# Patient Record
Sex: Male | Born: 1970 | Race: White | Hispanic: No | Marital: Married | Smoking: Never smoker
Health system: Southern US, Community
[De-identification: ages and names within clinical notes are randomized; demographics above are authoritative.]

## PROBLEM LIST (undated history)

## (undated) DIAGNOSIS — E669 Obesity, unspecified: Secondary | ICD-10-CM

## (undated) DIAGNOSIS — R002 Palpitations: Secondary | ICD-10-CM

## (undated) DIAGNOSIS — E079 Disorder of thyroid, unspecified: Secondary | ICD-10-CM

## (undated) DIAGNOSIS — F1020 Alcohol dependence, uncomplicated: Secondary | ICD-10-CM

## (undated) DIAGNOSIS — I480 Paroxysmal atrial fibrillation: Secondary | ICD-10-CM

## (undated) DIAGNOSIS — Z87898 Personal history of other specified conditions: Secondary | ICD-10-CM

## (undated) DIAGNOSIS — Z8679 Personal history of other diseases of the circulatory system: Secondary | ICD-10-CM

## (undated) HISTORY — DX: Palpitations: R00.2

## (undated) HISTORY — DX: Obesity, unspecified: E66.9

## (undated) HISTORY — DX: Personal history of other specified conditions: Z87.898

## (undated) HISTORY — DX: Personal history of other diseases of the circulatory system: Z86.79

## (undated) HISTORY — DX: Paroxysmal atrial fibrillation: I48.0

## (undated) HISTORY — PX: THYROIDECTOMY: SHX17

---

## 2018-08-03 ENCOUNTER — Encounter (HOSPITAL_COMMUNITY): Payer: Self-pay | Admitting: Emergency Medicine

## 2018-08-03 ENCOUNTER — Ambulatory Visit (HOSPITAL_COMMUNITY)
Admission: EM | Admit: 2018-08-03 | Discharge: 2018-08-03 | Disposition: A | Discharge status: Home / Self Care | Payer: Managed Care, Other (non HMO) | Attending: Family Medicine | Admitting: Family Medicine

## 2018-08-03 DIAGNOSIS — E785 Hyperlipidemia, unspecified: Secondary | ICD-10-CM

## 2018-08-03 DIAGNOSIS — R21 Rash and other nonspecific skin eruption: Secondary | ICD-10-CM | POA: Diagnosis not present

## 2018-08-03 DIAGNOSIS — Z760 Encounter for issue of repeat prescription: Secondary | ICD-10-CM

## 2018-08-03 DIAGNOSIS — E039 Hypothyroidism, unspecified: Secondary | ICD-10-CM

## 2018-08-03 HISTORY — DX: Disorder of thyroid, unspecified: E07.9

## 2018-08-03 MED ORDER — LIOTHYRONINE SODIUM 25 MCG PO TABS: 25.0000 ug | ORAL_TABLET | Freq: Every day | ORAL | 0 refills | Status: AC

## 2018-08-03 MED ORDER — CLOTRIMAZOLE-BETAMETHASONE 1-0.05 % EX CREA: TOPICAL_CREAM | CUTANEOUS | 0 refills | Status: DC

## 2018-08-03 MED ORDER — PROPRANOLOL HCL ER 160 MG PO CP24: 160.0000 mg | ORAL_CAPSULE | Freq: Every day | ORAL | 0 refills | Status: AC

## 2018-08-03 MED ORDER — SIMVASTATIN 20 MG PO TABS: 20.0000 mg | ORAL_TABLET | Freq: Every day | ORAL | 0 refills | Status: AC

## 2018-08-03 MED ORDER — LEVOTHYROXINE SODIUM 50 MCG PO TABS: 50.0000 ug | ORAL_TABLET | Freq: Every day | ORAL | 0 refills | Status: AC

## 2018-08-03 NOTE — ED Triage Notes (Signed)
Pt c/o rash on his R ankle x1 month

## 2018-08-05 NOTE — ED Provider Notes (Signed)
[facility]   [id number] 08/03/18 Arrival Time: 1523  ASSESSMENT & PLAN:  1. Rash and nonspecific skin eruption   2. Hypothyroidism, unspecified type   3. Hyperlipidemia, unspecified hyperlipidemia type   4. Medication refill    Meds ordered this encounter  Medications  . clotrimazole-betamethasone (LOTRISONE) cream    Sig: Apply to affected area 2 times daily for 1-2 weeks.    Dispense:  45 g    Refill:  0  . levothyroxine (SYNTHROID, LEVOTHROID) 50 MCG tablet    Sig: Take 1 tablet (50 mcg total) by mouth daily before breakfast.    Dispense:  30 tablet    Refill:  0  . liothyronine (CYTOMEL) 25 MCG tablet    Sig: Take 1 tablet (25 mcg total) by mouth daily.    Dispense:  30 tablet    Refill:  0  . propranolol ER (INDERAL LA) 160 MG SR capsule    Sig: Take 1 capsule (160 mg total) by mouth daily.    Dispense:  30 capsule    Refill:  0  . simvastatin (ZOCOR) 20 MG tablet    Sig: Take 1 tablet (20 mg total) by mouth daily at 6 PM.    Dispense:  30 tablet    Refill:  0   Trial of Lotrisone for rash on ankle. Medications refilled as above. Reports chronic medical problems are stable.  Has upcoming appt to see a new PCP. May f/u here as needed.  Reviewed expectations re: course of current medical issues. Questions answered. Outlined signs and symptoms indicating need for more acute intervention. Patient verbalized understanding. After Visit Summary given.   SUBJECTIVE:  Brett Bailey is a 48 y.o. male who presents with a skin complaint.   Location: inner R ankle Onset: gradual Duration: over the past month Associated pruritis? moderate Associated pain? none Progression: increasing steadily  Drainage? No  Known trigger? No  New soaps/lotions/topicals/detergents/environmental exposures? No Contacts with similar? No Recent travel? No  Other associated symptoms: none reported Therapies tried thus far: none Arthralgia or myalgia? none Recent illness?  none Fever? none No specific aggravating or alleviating factors reported. No injury to this area.  Also requests medication refills for diagnoses listed above. No concerns.  ROS: As per HPI. All other systems negative.   OBJECTIVE: Vitals:   08/03/18 1550  BP: 133/79  Pulse: 76  Resp: 16  Temp: 98.5 F (36.9 C)  TempSrc: Oral  SpO2: 100%    General appearance: alert; no distress Lungs: clear to auscultation bilaterally Heart: regular rate and rhythm Abd: soft; non-tender Extremities: no edema Skin: warm and dry; signs of infection: no; a 2x3 cm patch on inner R ankle of diffuse hyperkeratotic eruption; some underlying erythema; no tenderness to palpation; normal skin temperature; mild scaling Neuro: normal gait Psychological: alert and cooperative; normal mood and affect  No Known Allergies  Past Medical History:  Diagnosis Date  . Thyroid disease    Social History   Socioeconomic History  . Marital status: Married    Spouse name: Not on file  . Number of children: Not on file  . Years of education: Not on file  . Highest education level: Not on file  Occupational History  . Not on file  Social Needs  . Financial resource strain: Not on file  . Food insecurity:    Worry: Not on file    Inability: Not on file  . Transportation needs:    Medical: Not on file  Non-medical: Not on file  Tobacco Use  . Smoking status: Never Smoker  Substance and Sexual Activity  . Alcohol use: Yes  . Drug use: Never  . Sexual activity: Not on file  Lifestyle  . Physical activity:    Days per week: Not on file    Minutes per session: Not on file  . Stress: Not on file  Relationships  . Social connections:    Talks on phone: Not on file    Gets together: Not on file    Attends religious service: Not on file    Active member of club or organization: Not on file    Attends meetings of clubs or organizations: Not on file    Relationship status: Not on file  . Intimate  partner violence:    Fear of current or ex partner: Not on file    Emotionally abused: Not on file    Physically abused: Not on file    Forced sexual activity: Not on file  Other Topics Concern  . Not on file  Social History Narrative  . Not on file   Family History  Problem Relation Age of Onset  . Heart failure Father    History reviewed. No pertinent surgical history.   Mardella Layman, MD 08/05/18 1045

## 2019-08-03 ENCOUNTER — Ambulatory Visit: Payer: Managed Care, Other (non HMO) | Admitting: Podiatry

## 2019-08-03 ENCOUNTER — Encounter: Payer: Self-pay | Admitting: Podiatry

## 2019-08-03 ENCOUNTER — Other Ambulatory Visit: Payer: Self-pay

## 2019-08-03 VITALS — BP 131/70 | HR 72 | Resp 16

## 2019-08-03 DIAGNOSIS — L600 Ingrowing nail: Secondary | ICD-10-CM | POA: Diagnosis not present

## 2019-08-03 MED ORDER — NEOMYCIN-POLYMYXIN-HC 1 % OT SOLN: OTIC | 1 refills | Status: DC

## 2019-08-03 NOTE — Progress Notes (Signed)
  Subjective:  Patient ID: Brett Bailey, male    DOB: [birth year],  MRN: [id number] HPI Chief Complaint  Patient presents with  . Toe Pain    Hallux right - lateral border, tender, red, swollen, draining x 70months, tried trimming and cleaning, took antibiotic - completed Saturday  . New Patient (Initial Visit)    48 y.o. male presents with the above complaint.   ROS: Denies fever chills nausea vomiting muscle aches pains calf pain back pain chest pain shortness of breath.  Past Medical History:  Diagnosis Date  . Thyroid disease    No past surgical history on file.  Current Outpatient Medications:  .  clotrimazole-betamethasone (LOTRISONE) cream, Apply to affected area 2 times daily for 1-2 weeks., Disp: 45 g, Rfl: 0 .  cyclobenzaprine (FLEXERIL) 10 MG tablet, Take 5-10 mg by mouth 3 (three) times daily as needed., Disp: , Rfl:  .  levothyroxine (SYNTHROID, LEVOTHROID) 50 MCG tablet, Take 1 tablet (50 mcg total) by mouth daily before breakfast., Disp: 30 tablet, Rfl: 0 .  liothyronine (CYTOMEL) 25 MCG tablet, Take 1 tablet (25 mcg total) by mouth daily., Disp: 30 tablet, Rfl: 0 .  propranolol ER (INDERAL LA) 160 MG SR capsule, Take 1 capsule (160 mg total) by mouth daily., Disp: 30 capsule, Rfl: 0 .  simvastatin (ZOCOR) 20 MG tablet, Take 1 tablet (20 mg total) by mouth daily at 6 PM., Disp: 30 tablet, Rfl: 0  No Known Allergies Review of Systems Objective:   Vitals:   08/03/19 0944  BP: 131/70  Pulse: 72  Resp: 16    General: Well developed, nourished, in no acute distress, alert and oriented x3   Dermatological: Skin is warm, dry and supple bilateral. Nails x 10 are well maintained; remaining integument appears unremarkable at this time. There are no open sores, no preulcerative lesions, no rash or signs of infection present.  Sharp incurvated lateral nail border hallux right.  Purulence malodor granulation tissue is present.  Vascular: Dorsalis Pedis artery and Posterior  Tibial artery pedal pulses are 2/4 bilateral with immedate capillary fill time. Pedal hair growth present. No varicosities and no lower extremity edema present bilateral.   Neruologic: Grossly intact via light touch bilateral. Vibratory intact via tuning fork bilateral. Protective threshold with Semmes Wienstein monofilament intact to all pedal sites bilateral. Patellar and Achilles deep tendon reflexes 2+ bilateral. No Babinski or clonus noted bilateral.   Musculoskeletal: No gross boney pedal deformities bilateral. No pain, crepitus, or limitation noted with foot and ankle range of motion bilateral. Muscular strength 5/5 in all groups tested bilateral.  Gait: Unassisted, Nonantalgic.    Radiographs:  None taken  Assessment & Plan:   Assessment: Ingrown toenail right lateral border  Plan: Discussed etiology pathology conservative versus surgical therapies chemical matrixectomy was performed today after local anesthetic was administered.  Tolerated procedure well without complications.  Was provided with both oral and written home-going instructions for the care and soaking of the toe.  He was also provided with a prescription for Corticosporin otic to be applied twice daily after soaking.  I will follow-up with him in 2 weeks.     Chanie Soucek T. [place], [place]

## 2019-08-03 NOTE — Patient Instructions (Signed)

## 2019-08-17 ENCOUNTER — Ambulatory Visit: Payer: Managed Care, Other (non HMO) | Admitting: Podiatry

## 2020-01-22 ENCOUNTER — Other Ambulatory Visit: Payer: Self-pay

## 2020-01-22 ENCOUNTER — Emergency Department (HOSPITAL_COMMUNITY): Payer: Managed Care, Other (non HMO)

## 2020-01-22 ENCOUNTER — Emergency Department (HOSPITAL_COMMUNITY)
Admission: EM | Admit: 2020-01-22 | Discharge: 2020-01-22 | Disposition: A | Discharge status: Home / Self Care | Payer: Managed Care, Other (non HMO) | Attending: Emergency Medicine | Admitting: Emergency Medicine

## 2020-01-22 ENCOUNTER — Ambulatory Visit (INDEPENDENT_AMBULATORY_CARE_PROVIDER_SITE_OTHER)
Admission: EM | Admit: 2020-01-22 | Discharge: 2020-01-22 | Disposition: A | Discharge status: Home / Self Care | Payer: Managed Care, Other (non HMO) | Source: Home / Self Care

## 2020-01-22 ENCOUNTER — Encounter (HOSPITAL_COMMUNITY): Payer: Self-pay

## 2020-01-22 DIAGNOSIS — R9431 Abnormal electrocardiogram [ECG] [EKG]: Secondary | ICD-10-CM

## 2020-01-22 DIAGNOSIS — R072 Precordial pain: Secondary | ICD-10-CM | POA: Diagnosis not present

## 2020-01-22 DIAGNOSIS — R002 Palpitations: Secondary | ICD-10-CM

## 2020-01-22 DIAGNOSIS — F1020 Alcohol dependence, uncomplicated: Secondary | ICD-10-CM | POA: Insufficient documentation

## 2020-01-22 DIAGNOSIS — Z79899 Other long term (current) drug therapy: Secondary | ICD-10-CM | POA: Diagnosis not present

## 2020-01-22 HISTORY — DX: Alcohol dependence, uncomplicated: F10.20

## 2020-01-22 LAB — CBC
HCT: 45.9 % (ref 39.0–52.0)
Hemoglobin: 16.5 g/dL (ref 13.0–17.0)
MCH: 34.7 pg — ABNORMAL HIGH (ref 26.0–34.0)
MCHC: 35.9 g/dL (ref 30.0–36.0)
MCV: 96.4 fL (ref 80.0–100.0)
Platelets: 192 10*3/uL (ref 150–400)
RBC: 4.76 MIL/uL (ref 4.22–5.81)
RDW: 12.7 % (ref 11.5–15.5)
WBC: 8.5 10*3/uL (ref 4.0–10.5)
nRBC: 0 % (ref 0.0–0.2)

## 2020-01-22 LAB — BASIC METABOLIC PANEL
Anion gap: 11 (ref 5–15)
BUN: 11 mg/dL (ref 6–20)
CO2: 22 mmol/L (ref 22–32)
Calcium: 9.2 mg/dL (ref 8.9–10.3)
Chloride: 104 mmol/L (ref 98–111)
Creatinine, Ser: 0.72 mg/dL (ref 0.61–1.24)
GFR calc Af Amer: >60 mL/min (ref >60)
GFR calc non Af Amer: >60 mL/min (ref >60)
Glucose, Bld: 125 mg/dL — ABNORMAL HIGH (ref 70–99)
Potassium: 4 mmol/L (ref 3.5–5.1)
Sodium: 137 mmol/L (ref 135–145)

## 2020-01-22 LAB — TROPONIN I (HIGH SENSITIVITY): Troponin I (High Sensitivity): 3 ng/L (ref <18)

## 2020-01-22 MED ORDER — CHLORDIAZEPOXIDE HCL 25 MG PO CAPS: ORAL_CAPSULE | ORAL | 0 refills | Status: DC

## 2020-01-22 MED ORDER — SODIUM CHLORIDE 0.9% FLUSH: 3.0000 mL | Freq: Once | INTRAVENOUS | Status: DC

## 2020-01-22 NOTE — ED Triage Notes (Signed)
Pain in center of chest with deep breath that started on Wednesday. Temp on sat was 100, but that has went down.  Last night pt felt like his heart was racing.  He feels like his heart is racing now but not as bad as last night.  Reports his cp has gotten better but he stills feels it on and off.

## 2020-01-22 NOTE — ED Triage Notes (Signed)
Pt reports pain in the center of chest last week, but has resolved, but gets a pain with deep breathing. Pt reports he had a temp of 100 on Saturday. Approx 1230 last night he experienced heart palpitations where his heart skipped beats and fast. Resolved after 4 hours.

## 2020-01-22 NOTE — Discharge Instructions (Addendum)
You are seen in the ER for palpitations and chest discomfort.  It appears to [place] that you might possibly have a condition called pericarditis or atrial fibrillation.  Cardiomyopathy from heavy alcohol use is also a possibility.   Please follow-up with cardiology services requested for complete work-up.  We recommend that you return to the ER if you start having severe chest pain, dizziness, near fainting spells, shortness of breath or severe palpitations.   [facility]      [address] [place], [place] [address]  [phone]       [facility] [address] [place], [place] [address] [phone]  [facility] [place]        [phone]       [facility] [phone]  [facility] ([facility]             [address]                                         [place], [place]                                                [phone] or [phone]                               [facility] [place] [address] [place], [address] [phone]  [facility]    [address]      [place], [place]     [phone]       [facility] [address] [place], [place] [phone]  [facility]   [address] [place], [place] [address]     [phone]      Admissions: 8am-3pm M-F  [facility] ([facility]) [address] [place], [place] [phone]  [facility]: Residential Program [phone] Days)   [place], [place]      [phone] or [phone]     [facility]: [facility] [place], [place] (Walk in Hours over the weekend or by referral)  [facility] [address] [place], [place], [place] [address] [phone]  Crisis Mobile: [facility]:  [phone] (for crisis response 24 hours a day) [facility] Hotline:      [phone] Outpatient Psychiatry and Counseling  [facility]: Mobile Crisis Management 24 hours:  [phone]  [facility] [employer] sliding scale fee and walk in schedule: M-F 8am-12pm/1pm-3pm [address]  [place], [place] [address] [phone]  [facility] [address] [place], [place] [address] [phone]  [facility] new patient walk-in appointments available Monday - Friday 8am -3pm.          [address] [place], [place] [address] [phone] or crisis line- [phone]  [facility]/ Intensive Outpatient Therapy Program [address] [place], [place] [address] [phone]  [facility]                  Crisis Services      [phone] [address]     [place], [place] [address]  [facility] [phone]. [place], [place] [address]   [employer] of Care          [address]ette [address]  [place], [place] [address]       [phone]  [place], [place], [place] [address] [phone]  [facility]    [address] [place], [place] [address]     [place], [place] Joycelyn Man     [place] [address]     [phone]       [facility] [place] [address]  [facility]     [address] [place], [place], [place], [place] [address] Index     [address]     [place], [place] [address]     [phone]       [facility] [address] [place], [place] [address] [phone] Resources for Temporary Residential Assistance/Crisis [facility]) M-F 8am-3pm   [address] [place],  [address]   [phone] Services include: laundry, barbering, support groups, case management, phone  & computer access, showers, AA/NA mtgs, mental health/substance abuse nurse, job skills class, disability information, VA assistance, spiritual classes, etc.

## 2020-01-22 NOTE — ED Provider Notes (Signed)
[facility] EMERGENCY DEPARTMENT Provider Note   CSN: [id number] Arrival date & time: 01/22/20  1202     History Chief Complaint  Patient presents with  . Palpitations    Brett Bailey is a 49 y.o. male.  HPI  HPI: A 49 year old patient with a history of obesity presents for evaluation of chest pain. Initial onset of pain was more than 6 hours ago. The patient's chest pain is described as heaviness/pressure/tightness and is not worse with exertion. The patient's chest pain is middle- or left-sided, is not well-localized, is not sharp and does not radiate to the arms/jaw/neck. The patient does not complain of nausea and denies diaphoresis. The patient has no history of stroke, has no history of peripheral artery disease, has not smoked in the past 90 days, denies any history of treated diabetes, has no relevant family history of coronary artery disease (first degree relative at less than age 75), is not hypertensive and has no history of hypercholesterolemia.  49 year old male with history of thyroidectomy, on thyroid meds, alcoholism comes in a chief complaint of palpitations.  Patient reports that he started having some chest discomfort 5 or 6 days ago.  He had central chest pain that was worse with deep inspiration and was fairly constant.  He also felt a little more uncomfortable laying flat.  There was no exertional component to the pain and patient denies any associated shortness of breath, dizziness.  2 days ago patient started noticing low-grade fever.  He had a T-max of 100.  The chest pain resolved 2 days ago.  Yesterday night patient had palpitations that stayed for 4 hours.  Patient felt like his heart was racing and skipping beats.  He has not had that sensation before.  More recently he has not had any exertional chest pain or shortness of breath.  Patient denies any family history of premature CAD, arrhythmia, sudden cardiac death.  Patient does admit to heavy alcohol  consumption for the last several years.  He denies any cocaine use. Pt has no hx of PE, DVT and denies any exogenous hormone (testosterone / estrogen) use, long distance travels or surgery in the past 6 weeks, active cancer, recent immobilization.   Past Medical History:  Diagnosis Date  . Alcoholic ([place])   . Thyroid disease     Patient Active Problem List   Diagnosis Date Noted  . Weight gain 03/11/2009  . Libido, decreased 10/16/2008  . Insomnia 05/22/2005  . Tremor 05/22/2005  . Reactive airway disease 11/11/2004  . Headache 04/21/2004  . Alcohol abuse 05/28/2003  . 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 Drug abuse ([place]) 05/28/2003  . Nonspecific elevation of levels of transaminase or lactic acid dehydrogenase (LDH) 05/28/2003  . Pure hypercholesterolemia 03/16/2003  . Obesity 03/15/2003  . Bipolar affective disorder ([place]) 12/22/2001  . Family history of ischemic heart disease 12/22/2001  . Hypothyroidism 12/22/2001    Past Surgical History:  Procedure Laterality Date  . THYROIDECTOMY         Family History  Problem Relation Age of Onset  . Heart failure Father     Social History   Tobacco Use  . Smoking status: Never Smoker  . Smokeless tobacco: Never Used  Substance Use Topics  . Alcohol use: Yes    Comment:  1 pint of liquor daily  . Drug use: Never    Home Medications Prior to Admission medications   Medication Sig Start Date End Date Taking? Authorizing Provider  chlordiazePOXIDE (LIBRIUM) 25  MG capsule 50mg  PO TID x 1D, then 25-50mg  PO BID X 1D, then 25-50mg  PO QD X 1D 01/22/20   01/24/20, MD  clotrimazole-betamethasone (LOTRISONE) cream Apply to affected area 2 times daily for 1-2 weeks. 08/03/18   10/02/18, MD  cyclobenzaprine (FLEXERIL) 10 MG tablet Take 5-10 mg by mouth 3 (three) times daily as needed. 07/21/19   [provider]  levothyroxine (SYNTHROID, LEVOTHROID) 50 MCG tablet Take 1 tablet (50 mcg total) by mouth daily  before breakfast. 08/03/18   10/02/18, MD  liothyronine (CYTOMEL) 25 MCG tablet Take 1 tablet (25 mcg total) by mouth daily. 08/03/18   10/02/18, MD  NEOMYCIN-POLYMYXIN-HYDROCORTISONE (CORTISPORIN) 1 % SOLN OTIC solution Apply 1-2 drops to toe BID after soaking 08/03/19   Hyatt, Max T, DPM  propranolol ER (INDERAL LA) 160 MG SR capsule Take 1 capsule (160 mg total) by mouth daily. 08/03/18   10/02/18, MD  simvastatin (ZOCOR) 20 MG tablet Take 1 tablet (20 mg total) by mouth daily at 6 PM. 08/03/18   10/02/18, MD    Allergies    Patient has no known allergies.  Review of Systems   Review of Systems  Constitutional: Positive for activity change.  Respiratory: Negative for shortness of breath.   Cardiovascular: Positive for chest pain.  Gastrointestinal: Negative for nausea and vomiting.  Musculoskeletal: Positive for back pain.  Neurological: Negative for dizziness.  All other systems reviewed and are negative.   Physical Exam Updated Vital Signs BP 111/77 (BP Location: Right Arm)   Pulse 70   Temp 98.2 F (36.8 C) (Oral)   Resp 18   Ht 5\' 11"  (1.803 m)   Wt 124.7 kg   SpO2 100%   BMI 38.35 kg/m   Physical Exam Vitals and nursing note reviewed.  Constitutional:      Appearance: He is well-developed.  HENT:     Head: Atraumatic.  Cardiovascular:     Rate and Rhythm: Normal rate.     Pulses: Normal pulses.  Pulmonary:     Effort: Pulmonary effort is normal.  Musculoskeletal:     Cervical back: Neck supple.     Right lower leg: No edema.     Left lower leg: No edema.  Skin:    General: Skin is warm.     Findings: Lesion: .edekg.  Neurological:     Mental Status: He is alert and oriented to person, place, and time.     ED Results / Procedures / Treatments   Labs (all labs ordered are listed, but only abnormal results are displayed) Labs Reviewed  BASIC METABOLIC PANEL - Abnormal; Notable for the following components:      Result Value   Glucose,  Bld 125 (*)    All other components within normal limits  CBC - Abnormal; Notable for the following components:   MCH 34.7 (*)    All other components within normal limits  TROPONIN I (HIGH SENSITIVITY)  TROPONIN I (HIGH SENSITIVITY)    EKG None  Date: 01/22/2020  Rate: 72  Rhythm: normal sinus rhythm  QRS Axis: normal  Intervals: normal  ST/T Wave abnormalities: normal  Conduction Disutrbances: none  Narrative Interpretation: unremarkable   Radiology DG Chest 2 View  Result Date: 01/22/2020 CLINICAL DATA:  Chest pain, palpitations, and recent fever. EXAM: CHEST - 2 VIEW COMPARISON:  None. FINDINGS: The cardiomediastinal silhouette is within normal limits. The lungs are well inflated. No confluent airspace opacity, edema, sizable pleural effusion, or  pneumothorax is identified with note made of incomplete imaging of the posterior costophrenic angles. There is mild-to-moderate thoracic dextroscoliosis. IMPRESSION: No active cardiopulmonary disease. Electronically Signed   By: Sebastian Ache M.D.   On: 01/22/2020 14:28    Procedures Procedures (including critical care time)  Medications Ordered in ED Medications  sodium chloride flush (NS) 0.9 % injection 3 mL (has no administration in time range)    ED Course  I have reviewed the triage vital signs and the nursing notes.  Pertinent labs & imaging results that were available during my care of the patient were reviewed by me and considered in my medical decision making (see chart for details).    MDM Rules/Calculators/A&P HEAR Score: 79                        49 year old comes in a chief complaint of chest pain, palpitations, low-grade fever. His symptoms started 5 days ago with chest discomfort that has now been resolved for 2 days.  Subsequently he had a low-grade temperature of 100 without any cough.  He also had an episode of palpitations last night that lasted for 4 hours.  Based on history and exam, differential  diagnosis includes A. Fib, PSVT.  We also considered alcohol induced cardiomyopathy and pericarditis in the differential.  With the pleuritic pain we do not think patient has PE.  He is PERC negative and his well score is 0.  ACS also considered in the differential.  Plan is for patient to follow-up with cardiology.  I think he will need Holter/event monitoring and echocardiogram.  Strict ER return precautions have been discussed.  Patient is also willing to consider quitting alcohol.  He is supposed to see his PCP tomorrow.  Will provide outpatient resources.  Patient is wanting to consider Librium taper.  He has been prescribed Librium, with warning of ensuring that he does not mix it with alcohol.  Final Clinical Impression(s) / ED Diagnoses Final diagnoses:  Palpitations  Precordial chest pain  Alcohol use disorder, severe, in controlled environment [facility])    Rx / DC Orders ED Discharge Orders         Ordered    chlordiazePOXIDE (LIBRIUM) 25 MG capsule     Discontinue  Reprint     01/22/20 1609           Derwood Kaplan, MD 01/22/20 1621

## 2020-01-22 NOTE — ED Notes (Signed)
Patient is being discharged from the Urgent Care and sent to the Emergency Department via pov . Per [place], [place], patient is in need of higher level of care due to cp. Patient is aware and verbalizes understanding of plan of care.  Vitals:   01/22/20 1047  BP: 116/83  Pulse: 77  Resp: 18  Temp: 98.1 F (36.7 C)  SpO2: 96%

## 2020-01-29 ENCOUNTER — Encounter (INDEPENDENT_AMBULATORY_CARE_PROVIDER_SITE_OTHER): Payer: Self-pay | Admitting: *Deleted

## 2020-02-16 ENCOUNTER — Encounter: Payer: Self-pay | Admitting: Cardiology

## 2020-02-16 ENCOUNTER — Ambulatory Visit: Payer: Managed Care, Other (non HMO) | Admitting: Cardiology

## 2020-02-16 VITALS — BP 120/68 | HR 66 | Ht 71.0 in | Wt 274.0 lb

## 2020-02-16 DIAGNOSIS — R0789 Other chest pain: Secondary | ICD-10-CM | POA: Diagnosis not present

## 2020-02-16 DIAGNOSIS — R002 Palpitations: Secondary | ICD-10-CM

## 2020-02-16 NOTE — Progress Notes (Signed)
Clinical Summary Brett Bailey is a 49 y.o.male seen as new patient, seen today for the following medical problems.    1. Chest pain/Palpitations - seen in ER 01/22/20 with chest pain - trop neg x 1 - EKG SR, no ischemic changes - CXR no acute process  - midchest pain, pulled muscle like feeling. Worst with deep breathing, positional. Pain would come and go. Had some fever/felt flushed for about 24 hours.  - 2 days later had some palpitations. Flipping/racing feel, felt pulse and felt irregular. Episode x 7 hours - some lingering chest pains that has since resolved.  - limited caffeine. 7 days drinks 10-15 drinks vodka. - he is on propanolol for tremor - he reports thyroid levels have been controlled      Past Medical History:  Diagnosis Date  . Alcoholic (HCC)   . Thyroid disease      No Known Allergies   Current Outpatient Medications  Medication Sig Dispense Refill  . chlordiazePOXIDE (LIBRIUM) 25 MG capsule 50mg  PO TID x 1D, then 25-50mg  PO BID X 1D, then 25-50mg  PO QD X 1D 10 capsule 0  . clotrimazole-betamethasone (LOTRISONE) cream Apply to affected area 2 times daily for 1-2 weeks. 45 g 0  . cyclobenzaprine (FLEXERIL) 10 MG tablet Take 5-10 mg by mouth 3 (three) times daily as needed.    levothyroxine (SYNTHROID, LEVOTHROID) 50 MCG tablet Take 1 tablet (50 mcg total) by mouth daily before breakfast. 30 tablet 0  . liothyronine (CYTOMEL) 25 MCG tablet Take 1 tablet (25 mcg total) by mouth daily. 30 tablet 0  . NEOMYCIN-POLYMYXIN-HYDROCORTISONE (CORTISPORIN) 1 % SOLN OTIC solution Apply 1-2 drops to toe BID after soaking 10 mL 1  . propranolol ER (INDERAL LA) 160 MG SR capsule Take 1 capsule (160 mg total) by mouth daily. 30 capsule 0  . simvastatin (ZOCOR) 20 MG tablet Take 1 tablet (20 mg total) by mouth daily at 6 PM. 30 tablet 0   No current facility-administered medications for this visit.     Past Surgical History:  Procedure Laterality Date  .  THYROIDECTOMY       No Known Allergies    Family History  Problem Relation Age of Onset  . Heart failure Father      Social History Mr. Skipper reports that he has never smoked. He has never used smokeless tobacco. Mr. Kapusta reports current alcohol use.   Review of Systems CONSTITUTIONAL: No weight loss, fever, chills, weakness or fatigue.  HEENT: Eyes: No visual loss, blurred vision, double vision or yellow sclerae.No hearing loss, sneezing, congestion, runny nose or sore throat.  SKIN: No rash or itching.  CARDIOVASCULAR: per hpi RESPIRATORY: No shortness of breath, cough or sputum.  GASTROINTESTINAL: No anorexia, nausea, vomiting or diarrhea. No abdominal pain or blood.  GENITOURINARY: No burning on urination, no polyuria NEUROLOGICAL: No headache, dizziness, syncope, paralysis, ataxia, numbness or tingling in the extremities. No change in bowel or bladder control.  MUSCULOSKELETAL: No muscle, back pain, joint pain or stiffness.  LYMPHATICS: No enlarged nodes. No history of splenectomy.  PSYCHIATRIC: No history of depression or anxiety.  ENDOCRINOLOGIC: No reports of sweating, cold or heat intolerance. No polyuria or polydipsia.  Lemont Fillers   Physical Examination Today's Vitals   02/16/20 1035  BP: 120/68  Pulse: 66  SpO2: 98%  Weight: 274 lb (124.3 kg)  Height: 5\' 11"  (1.803 m)   Body mass index is 38.22 kg/m.  Gen: resting comfortably, no acute distress HEENT:  no scleral icterus, pupils equal round and reactive, no palptable cervical adenopathy,  CV: RRR, no m/r/g, no jvd Resp: Clear to auscultation bilaterally GI: abdomen is soft, non-tender, non-distended, normal bowel sounds, no hepatosplenomegaly MSK: extremities are warm, no edema.  Skin: warm, no rash Neuro:  no focal deficits Psych: appropriate affect     Assessment and Plan  1. Chest pain/palpitations - isolated episode. Chest pain consistent with pleuritic chest pain. In setting of mild fever  perhaps transient viral URI leading to some pleuritic chest pains. Resolved without recurrence, no symptoms to suggest ischemia or necessitate ischemic workup - palpitations x 7 hours likely was in a transient arrhythmia, perhaps also driven by a viral illness. Significant EtOH use that also may have triggered - no recurrent symptoms. If recurrence would plan on outpatient event monitor.   F/u 1 year. He is to contact us if recurrent symptosm prior    Antoine Poche, M.D.

## 2020-02-16 NOTE — Patient Instructions (Signed)

## 2020-04-16 ENCOUNTER — Other Ambulatory Visit: Payer: Self-pay

## 2020-04-16 ENCOUNTER — Ambulatory Visit
Admission: RE | Admit: 2020-04-16 | Discharge: 2020-04-16 | Disposition: A | Discharge status: Home / Self Care | Payer: Managed Care, Other (non HMO) | Source: Ambulatory Visit | Attending: Internal Medicine | Admitting: Internal Medicine

## 2020-04-16 VITALS — BP 124/81 | HR 77 | Temp 99.1°F | Resp 20

## 2020-04-16 DIAGNOSIS — J208 Acute bronchitis due to other specified organisms: Secondary | ICD-10-CM | POA: Diagnosis not present

## 2020-04-16 DIAGNOSIS — Z1152 Encounter for screening for COVID-19: Secondary | ICD-10-CM

## 2020-04-16 MED ORDER — PREDNISONE 20 MG PO TABS: 20.0000 mg | ORAL_TABLET | Freq: Every day | ORAL | 0 refills | Status: AC

## 2020-04-16 MED ORDER — BENZONATATE 100 MG PO CAPS: 100.0000 mg | ORAL_CAPSULE | Freq: Three times a day (TID) | ORAL | 0 refills | Status: DC

## 2020-04-16 MED ORDER — GUAIFENESIN ER 600 MG PO TB12: 600.0000 mg | ORAL_TABLET | Freq: Two times a day (BID) | ORAL | 0 refills | Status: AC

## 2020-04-16 NOTE — ED Triage Notes (Signed)
Pt presents with c/o cough and chest congestion as well as sore throat that began on Sunday

## 2020-04-16 NOTE — Discharge Instructions (Signed)
Please return to urgent care if you develop any worsening shortness of breath, cough ,fever or chills. Please quarantine until covid-19 test results is available.

## 2020-04-17 LAB — SARS-COV-2, NAA 2 DAY TAT

## 2020-04-17 LAB — NOVEL CORONAVIRUS, NAA: SARS-CoV-2, NAA: NOT DETECTED

## 2020-04-19 NOTE — ED Provider Notes (Signed)
[facility]    CSN: [id number] Arrival date & time: 04/16/20  1556      History   Chief Complaint Chief Complaint  Patient presents with  . Cough  . Sore Throat    HPI Brett Bailey is a 49 y.o. male comes to urgent care with a history of nonproductive cough, chest congestion and a sore throat of 3 days duration.  Patient denies any shortness of breath,generalized body aches or exposure to sick person.  No nausea, vomiting or diarrhea.  No dizziness, near syncope or syncopal episodes.  Oral intake is preserved.  Patient admits to having intermittent occasional wheezing.   He has had several episodes of bronchitis in the past and usually course of prednisone he states his recovery.  No fever or chills. HPI  Past Medical History:  Diagnosis Date  . Alcoholic (HCC)   . Thyroid disease     Patient Active Problem List   Diagnosis Date Noted  . Weight gain 03/11/2009  . Libido, decreased 10/16/2008  . Insomnia 05/22/2005  . Tremor 05/22/2005  . Reactive airway disease 11/11/2004  . Headache 04/21/2004  . Alcohol abuse 05/28/2003  . 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 Drug abuse (HCC) 05/28/2003  . Nonspecific elevation of levels of transaminase or lactic acid dehydrogenase (LDH) 05/28/2003  . Pure hypercholesterolemia 03/16/2003  . Obesity 03/15/2003  . Bipolar affective disorder (HCC) 12/22/2001  . Family history of ischemic heart disease 12/22/2001  . Hypothyroidism 12/22/2001    Past Surgical History:  Procedure Laterality Date  . THYROIDECTOMY         Home Medications    Prior to Admission medications   Medication Sig Start Date End Date Taking? Authorizing Provider  benzonatate (TESSALON) 100 MG capsule Take 1 capsule (100 mg total) by mouth every 8 (eight) hours. 04/16/20   Kennede Lusk, Britta Mccreedy, MD  chlordiazePOXIDE (LIBRIUM) 25 MG capsule 50mg  PO TID x 1D, then 25-50mg  PO BID X 1D, then 25-50mg  PO QD X 1D 01/22/20   01/24/20, MD    cyclobenzaprine (FLEXERIL) 10 MG tablet Take 5-10 mg by mouth 3 (three) times daily as needed. 07/21/19   [provider]  guaiFENesin (MUCINEX) 600 MG 12 hr tablet Take 1 tablet (600 mg total) by mouth 2 (two) times daily for 10 days. 04/16/20 04/26/20  04/28/20, MD  levothyroxine (SYNTHROID, LEVOTHROID) 50 MCG tablet Take 1 tablet (50 mcg total) by mouth daily before breakfast. 08/03/18   10/02/18, MD  liothyronine (CYTOMEL) 25 MCG tablet Take 1 tablet (25 mcg total) by mouth daily. 08/03/18   10/02/18, MD  predniSONE (DELTASONE) 20 MG tablet Take 1 tablet (20 mg total) by mouth daily for 5 days. 04/16/20 04/21/20  Lamptey9/21/21, MD  propranolol ER (INDERAL LA) 160 MG SR capsule Take 1 capsule (160 mg total) by mouth daily. 08/03/18   10/02/18, MD  simvastatin (ZOCOR) 20 MG tablet Take 1 tablet (20 mg total) by mouth daily at 6 PM. 08/03/18   10/02/18, MD    Family History Family History  Problem Relation Age of Onset  . Heart failure Father     Social History Social History   Tobacco Use  . Smoking status: Never Smoker  . Smokeless tobacco: Never Used  Substance Use Topics  . Alcohol use: Yes    Comment:  1 pint of liquor daily  . Drug use: Never     Allergies   Patient has no known  allergies.   Review of Systems Review of Systems  Constitutional: Negative.   HENT: Positive for congestion and sore throat.   Respiratory: Positive for cough. Negative for shortness of breath and wheezing.   Neurological: Negative.  Negative for headaches.     Physical Exam Triage Vital Signs ED Triage Vitals  Enc Vitals Group     BP 04/16/20 1644 124/81     Pulse Rate 04/16/20 1644 77     Resp 04/16/20 1644 20     Temp 04/16/20 1644 99.1 F (37.3 C)     Temp src --      SpO2 04/16/20 1644 96 %     Weight --      Height --      Head Circumference --      Peak Flow --      Pain Score 04/16/20 1643 4     Pain Loc --      Pain Edu? --      Excl.  in GC? --    No data found.  Updated Vital Signs BP 124/81   Pulse 77   Temp 99.1 F (37.3 C)   Resp 20   SpO2 96%   Visual Acuity Right Eye Distance:   Left Eye Distance:   Bilateral Distance:    Right Eye Near:   Left Eye Near:    Bilateral Near:     Physical Exam Vitals and nursing note reviewed.  Constitutional:      General: He is not in acute distress.    Appearance: He is not ill-appearing.  HENT:     Right Ear: Tympanic membrane normal.     Left Ear: Tympanic membrane normal.     Nose: No congestion or rhinorrhea.     Mouth/Throat:     Mouth: Mucous membranes are moist.  Eyes:     Conjunctiva/sclera: Conjunctivae normal.  Neck:     Thyroid: No thyromegaly.  Cardiovascular:     Rate and Rhythm: Normal rate and regular rhythm.  Pulmonary:     Effort: Pulmonary effort is normal.     Breath sounds: Normal breath sounds.  Abdominal:     General: Bowel sounds are normal.     Palpations: Abdomen is soft.  Musculoskeletal:     Cervical back: Neck supple.  Lymphadenopathy:     Cervical: No cervical adenopathy.  Neurological:     Mental Status: He is alert.      UC Treatments / Results  Labs (all labs ordered are listed, but only abnormal results are displayed) Labs Reviewed  NOVEL CORONAVIRUS, NAA   Narrative:    Performed at:  [address] [place], [place]  [id number] Lab Director: Jolene Schimke MD, Phone:  [phone]  SARS-COV-2, NAA 2 DAY TAT   Narrative:    Performed at:  [address] [place], [place]  [id number] Lab Director: Jolene Schimke MD, Phone:  [phone]    EKG   Radiology No results found.  Procedures Procedures (including critical care time)  Medications Ordered in UC Medications - No data to display  Initial Impression / Assessment and Plan / UC Course  I have reviewed the triage vital signs and the nursing notes.  Pertinent labs & imaging results that were  available during my care of the patient were reviewed by me and considered in my medical decision making (see chart for details).     1.  Acute viral bronchitis: Mucinex as needed for  thick secretions Prednisone 20 mg orally daily for 5 days Tessalon Perles as needed for cough COVID-19 PCR sent Patient advised to quarantine until COVID-19 test results are available If patient symptoms worsens he is advised to return to the urgent care to be reevaluated. Final Clinical Impressions(s) / UC Diagnoses   Final diagnoses:  Acute viral bronchitis     Discharge Instructions     Please return to urgent care if you develop any worsening shortness of breath, cough ,fever or chills. Please quarantine until covid-19 test results is available.   ED Prescriptions    Medication Sig Dispense Auth. Provider   guaiFENesin (MUCINEX) 600 MG 12 hr tablet Take 1 tablet (600 mg total) by mouth 2 (two) times daily for 10 days. 20 tablet Daily Crate, Britta Mccreedy, MD   predniSONE (DELTASONE) 20 MG tablet Take 1 tablet (20 mg total) by mouth daily for 5 days. 5 tablet Remmi Armenteros, Britta Mccreedy, MD   benzonatate (TESSALON) 100 MG capsule Take 1 capsule (100 mg total) by mouth every 8 (eight) hours. 21 capsule Buffie Herne, Britta Mccreedy, MD     PDMP not reviewed this encounter.   Merrilee Jansky, MD 04/19/20 [phone]

## 2020-08-30 ENCOUNTER — Ambulatory Visit: Payer: Managed Care, Other (non HMO) | Admitting: Physical Therapy

## 2020-09-03 ENCOUNTER — Other Ambulatory Visit: Payer: Self-pay

## 2020-09-03 ENCOUNTER — Encounter: Payer: Self-pay | Admitting: Physical Therapy

## 2020-09-03 ENCOUNTER — Ambulatory Visit: Payer: Managed Care, Other (non HMO) | Attending: Family Medicine | Admitting: Physical Therapy

## 2020-09-03 DIAGNOSIS — R252 Cramp and spasm: Secondary | ICD-10-CM | POA: Insufficient documentation

## 2020-09-03 DIAGNOSIS — M5412 Radiculopathy, cervical region: Secondary | ICD-10-CM | POA: Diagnosis not present

## 2020-09-03 NOTE — Therapy (Signed)
[facility] [address] [place], [place], [address] Phone: [phone]   Fax:  [phone]  Physical Therapy Evaluation  Patient Details  Name: Brett Bailey MRN: [id number] Date of Birth: 29-Oct-1970 Referring Provider (PT): Farris Has MD   Encounter Date: 09/03/2020   PT End of Session - 09/03/20 1108    Visit Number 1    Date for PT Re-Evaluation 10/29/20    Authorization Type [employer]    PT Start Time 1108    PT Stop Time 1148    PT Time Calculation (min) 40 min    Activity Tolerance Patient tolerated treatment well    Behavior During Therapy [facility] for tasks assessed/performed           Past Medical History:  Diagnosis Date  . Alcoholic (HCC)   . Thyroid disease     Past Surgical History:  Procedure Laterality Date  . THYROIDECTOMY      There were no vitals filed for this visit.    Subjective Assessment - 09/03/20 1108    Subjective A few months ago patient began experiencing tingling into left UE to digits 1-3. Was every 10 min, now 10x/day and worse when sleeping on his side. Not a lot of pain now and feels some tugging down into shoulder blade. Most of the pain resolved with Prednisone and Flexeril. Pain now 2/10. His lower back bothers him intemittently and Flexeril usually knocks it out. Easy to aggravate his back now. Patient had a pop and pain in low back when cleaning the shower about 2 months ago. Patient reports he is very uptight. He did see an orthopedist. No back pain today.    Pertinent History thyroid disease, obesity, alcoholism, back pain    Limitations Other (comment)   sleeping   Patient Stated Goals get rid of pain and tingling    Currently in Pain? Yes    Pain Score 2     Pain Location Neck    Pain Orientation Left;Posterior    Pain Descriptors / Indicators Pressure;Burning;Tingling    Pain Type Acute pain    Pain Radiating Towards to left hand digits 1-3    Pain Onset More than a  month ago    Pain Frequency Constant    Aggravating Factors  sleeping on side, driving in car, sitting at desk    Pain Relieving Factors head down and to the right              [facility] PT Assessment - 09/03/20 0001      Assessment   Medical Diagnosis Cervicalgia    Referring Provider (PT) Farris Has MD    Onset Date/Surgical Date 06/11/20    Hand Dominance Right    Next MD Visit not yet      Precautions   Precautions None      Restrictions   Weight Bearing Restrictions No      Balance Screen   Has the patient fallen in the past 6 months No    Has the patient had a decrease in activity level because of a fear of falling?  No    Is the patient reluctant to leave their home because of a fear of falling?  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      Observation/Other Assessments   Focus on Therapeutic Outcomes (FOTO)  FSPM = 62 (goal 71)      Posture/Postural Control   Posture/Postural Control Postural limitations    Postural Limitations Rounded Shoulders;Forward head;Increased thoracic kyphosis      ROM / Strength   AROM / PROM / Strength AROM;Strength      AROM   AROM Assessment Site Cervical    Cervical Flexion full    Cervical Extension full    Cervical - Right Side Bend 48    Cervical - Left Side Bend 38    Cervical - Right Rotation 50    Cervical - Left Rotation 53      Strength   Overall Strength Comments elbow wrist 5/5    Strength Assessment Site Shoulder;Hand    Right/Left Shoulder Left    Left Shoulder Flexion 5/5    Left Shoulder Extension 5/5    Left Shoulder ABduction 5/5    Right/Left hand Left    Left Hand Grip (lbs) 103 avg   Rt (dominant) = 99 avg     Palpation   Palpation comment marked tightness in bil UT and  cervical paraspinals and suboccipitals; also in left pectorals      Special Tests    Special Tests Cervical    Other special tests compression and spurlings did not provoke sx; no change in sx with distraction, however tinging in arm not present during testing. Cervical retraction did help with sx.                      Objective measurements completed on examination: See above findings.               PT Education - 09/03/20 1857    Education Details HEP/postural ed/desk set up    [employer]) Educated Patient    Methods Explanation;Demonstration;Handout    Comprehension Verbalized understanding;Returned demonstration            PT Short Term Goals - 09/03/20 1915      PT SHORT TERM GOAL #1   Title Ind with initial HEP    Time 4    Period Weeks    Status New    Target Date 10/01/20      PT SHORT TERM GOAL #2   Title decreased frequency and intensity of sx by 30%    Time 4    Period Weeks    Status New             PT Long Term Goals - 09/03/20 1916      PT LONG TERM GOAL #1   Title Ind with advanced HEP for postural strength and flexibility    Time 8    Period Weeks    Status New    Target Date 10/29/20      PT LONG TERM GOAL #2   Title Patient to report decreased Lt UE sx by 90% or more with ADLS.    Time 8    Period Weeks    Status New      PT LONG TERM GOAL #3   Title Patient able to sleep without onset of UE radicular sx.    Time 8    Period Weeks    Status New      PT LONG TERM GOAL #4   Title improved FOTO to >= 71    Baseline eval FS= 62  Plan - 09/03/20 1903    Clinical Impression Statement Patient presents with c/o left UE N/T x 2.5 months. Pain has improved to 2/10 with two rounds of Prednisone and he now feels N/T intermittently to digits 1-3 and occassional tugging down left throracic paraspinals. Symptoms are worse with sidelying either way, making it difficult for pt to get to sleep. Symptoms  come on about 10x per day but pt unclear of what causes them. He has forward head posture with rounded shoulders, tight pectorals and weak postural muscles which are likely contributing to sx. Retraction gave him some relief today. Palpation of left UT/levator area did reproduce sx and patient is very tight in these muscles and in his neck in general. ROM is [facility] but limited by tightness. UE strength including grip is normal, however pt reports weakness when sx occur.  Negative cervical special tests. Patient also reports chronic low back pain which was exacerbated in eval with doorway stretch at 120 degrees. He would benefit from overall spinal stretching. Patient will benefit from PT to address these deficits.    Personal Factors and Comorbidities Comorbidity 3+    Comorbidities thyroid disease, obesity, alcoholism, back pain    Examination-Activity Limitations Sleep    Stability/Clinical Decision Making Evolving/Moderate complexity    Clinical Decision Making Low    Rehab Potential Excellent    PT Frequency 2x / week    PT Duration 8 weeks    PT Treatment/Interventions ADLs/Self Care Home Management;Cryotherapy;Electrical Stimulation;Iontophoresis 4mg /ml Dexamethasone;Moist Heat;Traction;Neuromuscular re-education;Therapeutic exercise;Therapeutic activities;Patient/family education;Manual techniques;Dry needling;Spinal Manipulations    PT Next Visit Plan Review HEP, progress cervical retraction, postural strength, chest flexibility, STW/man/DN to cervical/UT    PT Home Exercise Plan [email or web address]    Consulted and Agree with Plan of Care Patient           Patient will benefit from skilled therapeutic intervention in order to improve the following deficits and impairments:  Obesity,Impaired UE functional use,Increased muscle spasms,Pain,Impaired flexibility,Postural dysfunction,Decreased strength  Visit Diagnosis: Radiculopathy, cervical region - Plan: PT plan of care cert/re-cert  Cramp and spasm  - Plan: PT plan of care cert/re-cert     Problem List Patient Active Problem List   Diagnosis Date Noted  . Weight gain 03/11/2009  . Libido, decreased 10/16/2008  . Insomnia 05/22/2005  . Tremor 05/22/2005  . Reactive airway disease 11/11/2004  . Headache 04/21/2004  . Alcohol abuse 05/28/2003  . 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 Drug abuse (HCC) 05/28/2003  . Nonspecific elevation of levels of transaminase or lactic acid dehydrogenase (LDH) 05/28/2003  . Pure hypercholesterolemia 03/16/2003  . Obesity 03/15/2003  . Bipolar affective disorder (HCC) 12/22/2001  . Family history of ischemic heart disease 12/22/2001  . Hypothyroidism 12/22/2001   12/24/2001 PT 09/03/2020, 7:21 PM  [facility] [address] [place], [place], [place] Phone: [phone]   Fax:  [phone]  Name: Brett Bailey MRN: Reola Mosher Date of Birth: 1971/04/10

## 2020-09-03 NOTE — Patient Instructions (Signed)
Access Code: [email or web address] URL: [email or web address] Date: 09/03/2020 Prepared by: Raynelle Fanning  Exercises Seated Upright Posture Correction - 1 x daily - 7 x weekly - 1 sets - 10 reps Correct Standing Posture - 1 x daily - 7 x weekly - 1 sets - 10 reps Seated Cervical Retraction - 3 x daily - 7 x weekly - 1 sets - 10 reps - 3-5 sec hold  Patient Education Office Posture

## 2021-01-12 IMAGING — DX DG CHEST 2V
2 series · 2 of 2 positions shown · non-contrast
Comparison: None.

CLINICAL DATA: Chest pain, palpitations, and recent fever.

EXAM:
CHEST - 2 VIEW

[chest pa]
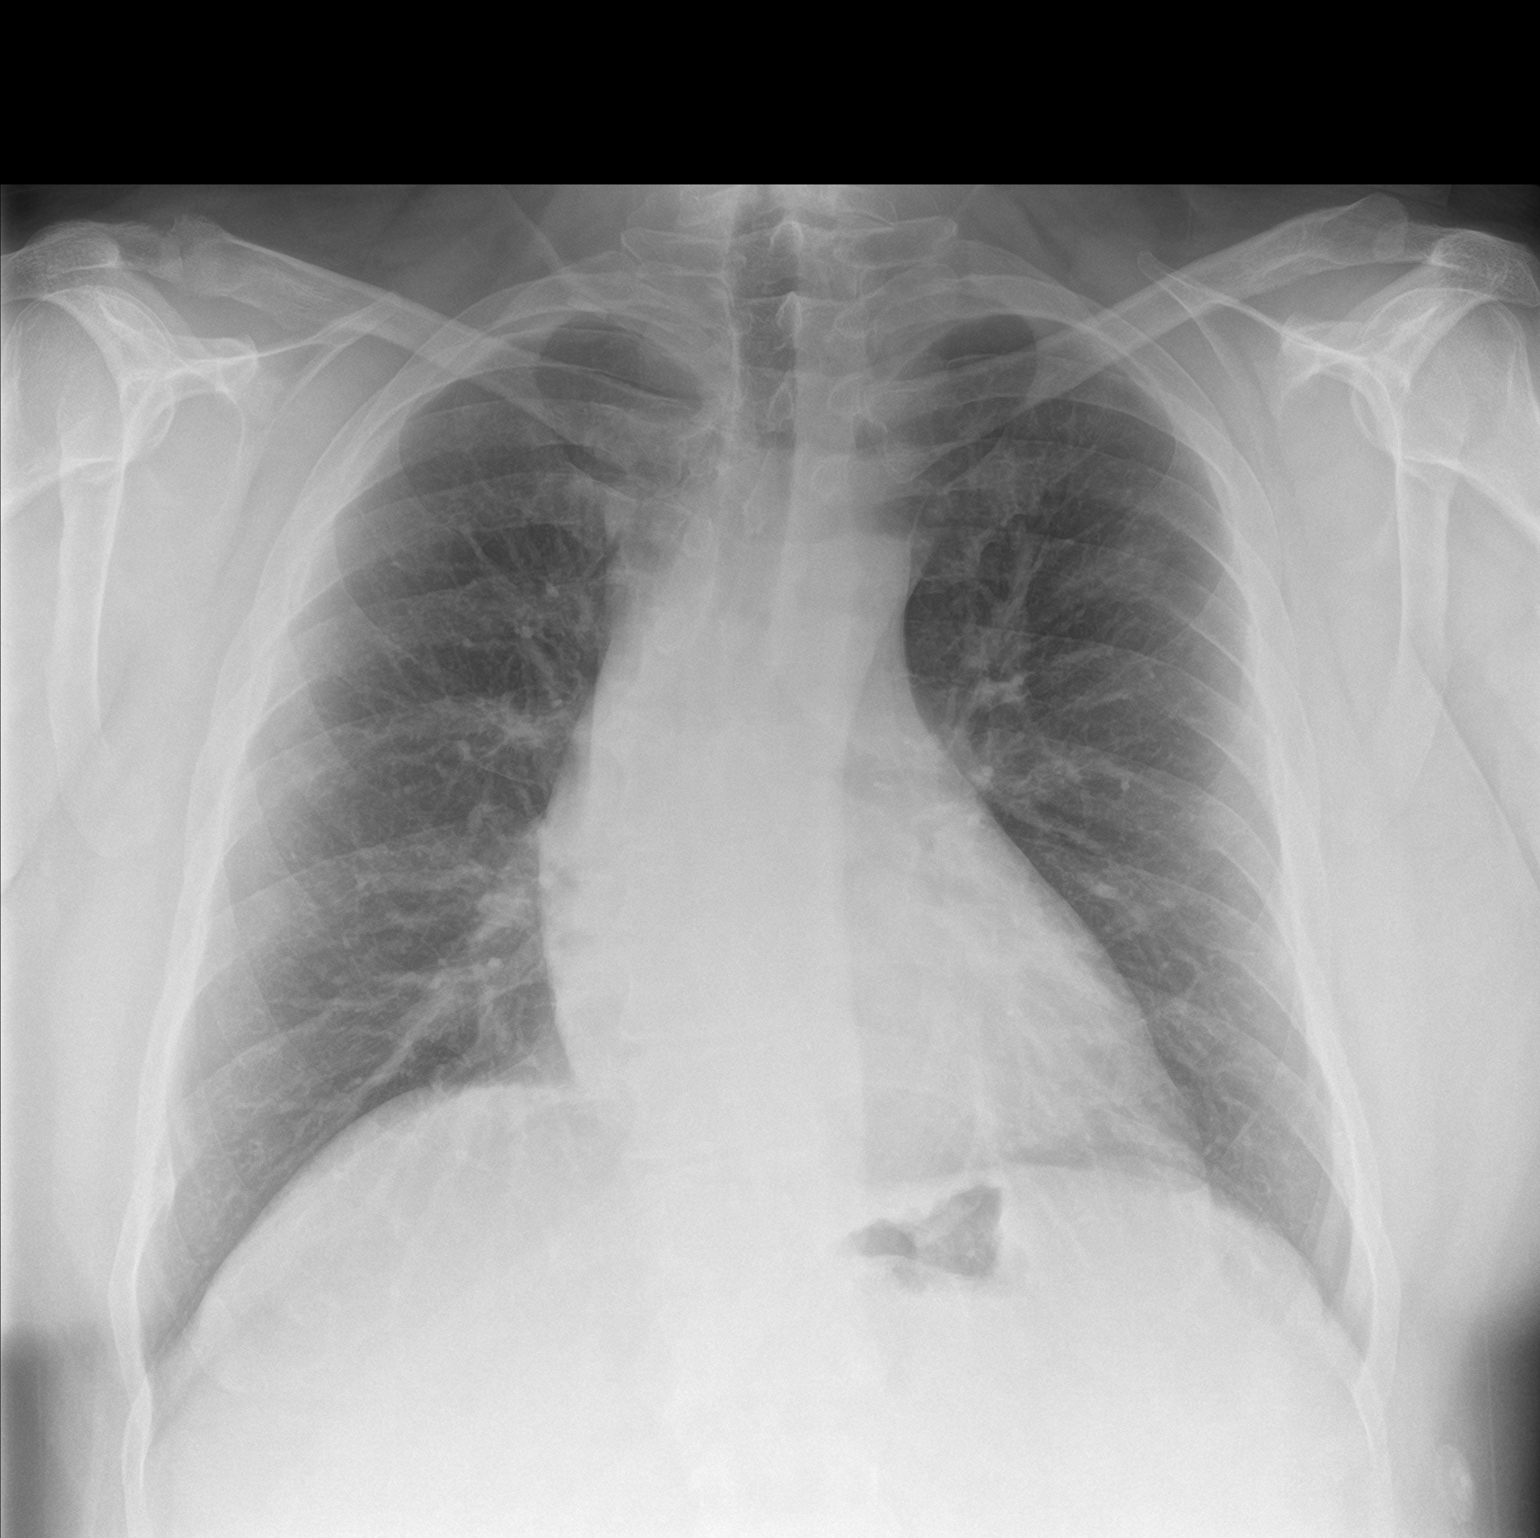

[chest lat]
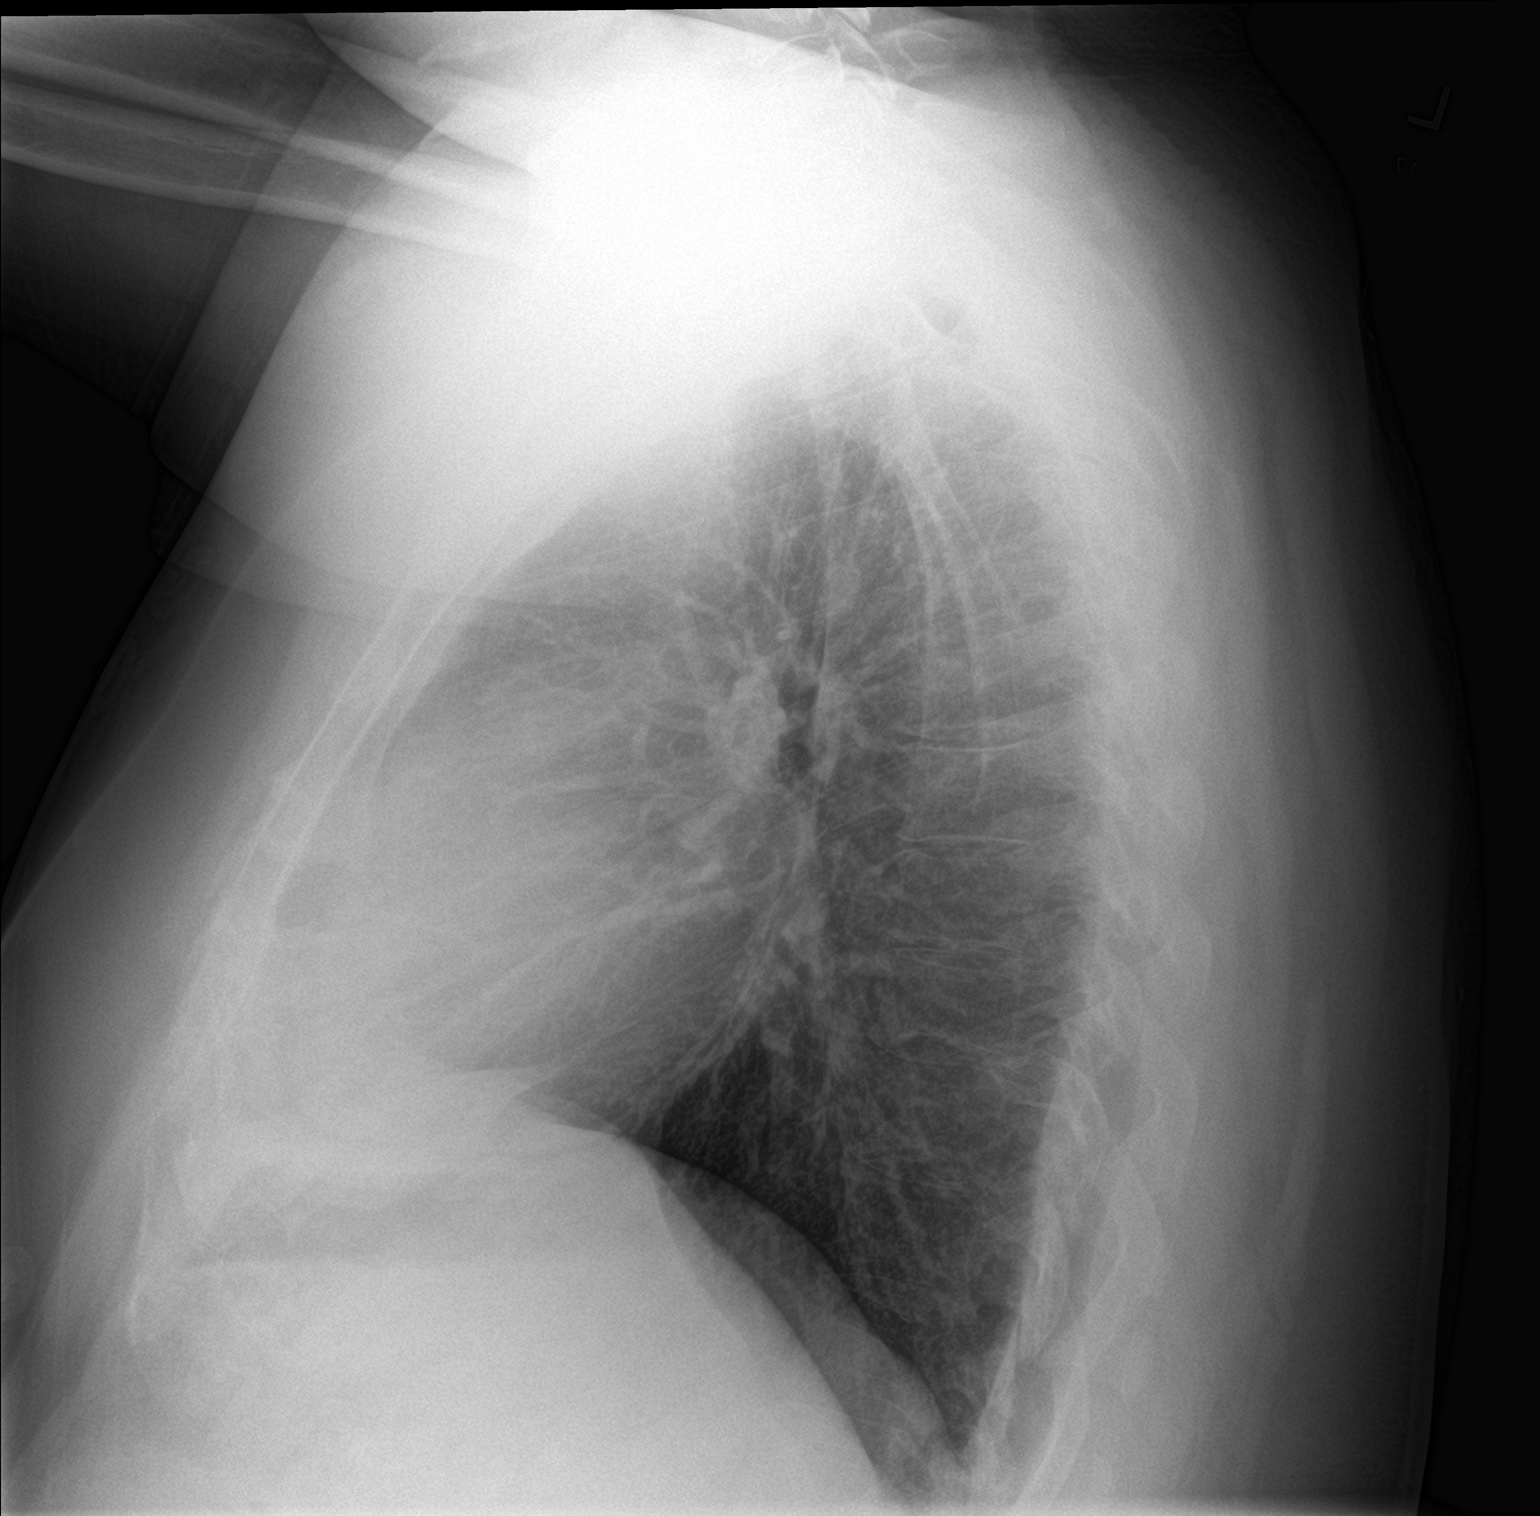

[2 of 2 positions shown; findings below may reference images not displayed]

FINDINGS: The cardiomediastinal silhouette is within normal limits. The lungs
are well inflated. No confluent airspace opacity, edema, sizable
pleural effusion, or pneumothorax is identified with note made of
incomplete imaging of the posterior costophrenic angles. There is
mild-to-moderate thoracic dextroscoliosis.
IMPRESSION: No active cardiopulmonary disease.

## 2021-09-12 ENCOUNTER — Ambulatory Visit (HOSPITAL_COMMUNITY): Payer: Managed Care, Other (non HMO) | Admitting: Physical Therapy

## 2021-10-03 ENCOUNTER — Ambulatory Visit (HOSPITAL_COMMUNITY): Payer: Managed Care, Other (non HMO) | Attending: Physical Medicine & Rehabilitation | Admitting: Physical Therapy

## 2021-10-03 ENCOUNTER — Other Ambulatory Visit: Payer: Self-pay

## 2021-10-03 ENCOUNTER — Encounter (HOSPITAL_COMMUNITY): Payer: Self-pay | Admitting: Physical Therapy

## 2021-10-03 DIAGNOSIS — M5412 Radiculopathy, cervical region: Secondary | ICD-10-CM | POA: Diagnosis present

## 2021-10-03 DIAGNOSIS — R252 Cramp and spasm: Secondary | ICD-10-CM | POA: Insufficient documentation

## 2021-10-03 DIAGNOSIS — R293 Abnormal posture: Secondary | ICD-10-CM | POA: Insufficient documentation

## 2021-10-03 DIAGNOSIS — Z789 Other specified health status: Secondary | ICD-10-CM | POA: Diagnosis present

## 2021-10-03 NOTE — Therapy (Signed)
[place] ?[facility] ?[address] ?[place], [place], [address] ?Phone: [phone]   Fax:  [phone] ? ?Physical Therapy Evaluation ? ?Patient Details  ?Name: Brett Bailey ?MRN: [id number] ?Date of Birth: [birth year] ?Referring Provider (PT): Aviva Signs, MD ? ? ?Encounter Date: 10/03/2021 ? ? PT End of Session - 10/03/21 0954   ? ? Visit Number 1   ? Number of Visits 6   ? Date for PT Re-Evaluation 11/14/21   ? Authorization Type Cign[employer] Managed($50 copay)   ? Authorization Time Period 09/03/2021 - Current   ? Authorization - Visit Number 1   ? Authorization - Number of Visits 20   ? Progress Note Due on Visit 6   ? PT Start Time [phone]   ? PT Stop Time 0950   ? PT Time Calculation (min) 47 min   ? Activity Tolerance Patient tolerated treatment well   ? Behavior During Therapy [facility] for tasks assessed/performed   ? ?  ?  ? ?  ? ? ?Past Medical History:  ?Diagnosis Date  ? Alcoholic (HCC)   ? Thyroid disease   ? ? ?Past Surgical History:  ?Procedure Laterality Date  ? THYROIDECTOMY    ? ? ?There were no vitals filed for this visit. ? ? ? Subjective Assessment - 10/03/21 0913   ? ? Subjective Patient states that the previous reports about his pain are accurate("A few months ago patient began experiencing tingling into left UE to digits 1-3. Was every 10 min, now 10x/day and worse when sleeping on his side. Not a lot of pain now and feels some tugging down into shoulder blade. Most of the pain resolved with Prednisone and Flexeril. Pain now 2/10. His lower back bothers him intemittently and Flexeril usually knocks it out. Easy to aggravate his back now. Patient had a pop and pain in low back when cleaning the shower about 2 months ago. Patient reports he is very uptight. He did see an orthopedist. No back pain today.") He has since had a guided injection into the neck and he has felt great relief from that intervention. The injection was about 8 weeks ago and he has not been having  numbness or tingling in the LUE for around 6 weeks. He does have a job which requires a high degree of sitting and he is aware that his posture needs work.   ? Patient Stated Goals get rid of pain and tingling   ? Currently in Pain? Yes   ? Pain Score 1    ? Pain Location Neck   ? Pain Orientation Posterior;Left   ? ?  ?  ? ?  ? ? ? ? ? [facility] PT Assessment - 10/03/21 0001   ? ?  ? Assessment  ? Medical Diagnosis Lt Cervical radiculopathy   ? Referring Provider (PT) Aviva Signs, MD   ? Onset Date/Surgical Date 06/11/20   ? Hand Dominance Right   ?  ? Observation/Other Assessments  ? Focus on Therapeutic Outcomes (FOTO)  64%   ?  ? Sensation  ? Light Touch --   Denies sensation changes today  ?  ? AROM  ? AROM Assessment Site Cervical   ? Cervical Flexion 46   ? Cervical Extension 45   ? Cervical - Right Side Bend 34   Lt sided neck pressure  ? Cervical - Left Side Bend 36   ? Cervical - Right Rotation 61   ? Cervical - Left Rotation 54   ?  ?  Strength  ? Overall Strength --   Denies strength changes  ?  ? Special Tests  ?  Special Tests Cervical   ? Cervical Tests Spurling's;Dictraction;other   ?  ? Spurling's  ? Findings Negative   ? Side --   Bilateral  ?  ? Distraction Test  ? Findngs Positive   ? Comment Improvement in symptoms   ?  ? other   ? Findings Negative   ? Comment Compression   ? ?  ?  ? ?  ? ? ? ? ? ? ? ? ? ? ? ? ? ?Objective measurements completed on examination: See above findings.  ? ? ? ? ? [facility] Adult PT Treatment/Exercise - 10/03/21 0001   ? ?  ? Exercises  ? Exercises Neck   ?  ? Neck Exercises: Stretches  ? Other Neck Stretches Thoracic extension over pillow roll 3x10   ? ?  ?  ? ?  ? ? ? ? ? ? ? ? ? ? PT Education - 10/03/21 1417   ? ? Education Details HEP, POC, posture, and workplace ergonomics.   ? Person(s) Educated Patient   ? Methods Explanation;Handout   ? Comprehension Verbalized understanding   ? ?  ?  ? ?  ? ? ? PT Short Term Goals - 09/03/20 1915   ? ?  ? PT SHORT TERM GOAL #1   ? Title Ind with initial HEP   ? Time 4   ? Period Weeks   ? Status New   ? Target Date 10/01/20   ?  ? PT SHORT TERM GOAL #2  ? Title decreased frequency and intensity of sx by 30%   ? Time 4   ? Period Weeks   ? Status New   ? ?  ?  ? ?  ? ? ? ? PT Long Term Goals - 10/03/21 1418   ? ?  ? PT LONG TERM GOAL #1  ? Title Patient will achieve a FOTO score of at least 71% function.   ? Time 6   ? Period Weeks   ? Status New   ? Target Date 11/14/21   ?  ? PT LONG TERM GOAL #2  ? Title Patient will be able to achieve cervical end ROM for all motions without an onset of pain/pressure.   ? Time 6   ? Period Weeks   ? Status New   ? Target Date 11/14/21   ?  ? PT LONG TERM GOAL #3  ? Title Patient will report being able to self monitor and correct his sitting and standing posture for greater than half of each day.   ? Time 6   ? Period Weeks   ? Status New   ? Target Date 11/14/21   ?  ? PT LONG TERM GOAL #4  ? Title Patient will be independent with his HEP.   ? Time 6   ? Period Weeks   ? Status New   ? Target Date 11/14/21   ? ?  ?  ? ?  ? ? ? ? ? ? ? ? ? Plan - 10/03/21 1421   ? ? Clinical Impression Statement Patient is a 51 y.o. male presenting to physical therapy with c/o lingering pressure in the Lt cervical spine following an injection attempting to treat radiculopathy. He presents with pain limited deficits in cervical , ROM, postural impairments, spinal mobility and functional mobility with ADL. He is having to modify and  restrict ADLs as indicated by FOTO score as well as subjective information and objective measures which is affecting overall participation. Patient will benefit from skilled physical therapy in order to improve function and reduce impairment.   ? Personal Factors and Comorbidities Comorbidity 3+   ? Comorbidities thyroid disease, obesity, alcoholism, back pain   ? Examination-Activity Limitations Sleep;Sit;Stand;Lift   ? Stability/Clinical Decision Making Stable/Uncomplicated   ? Clinical  Decision Making Low   ? Rehab Potential Fair   ? PT Frequency 1x / week   ? PT Duration 6 weeks   ? PT Treatment/Interventions ADLs/Self Care Home Management;Cryotherapy;Electrical Stimulation;Iontophoresis 4mg /ml Dexamethasone;Moist Heat;Traction;Neuromuscular re-education;Therapeutic exercise;Therapeutic activities;Patient/family education;Manual techniques;Dry needling;Spinal Manipulations   ? PT Next Visit Plan Review HEP and add more Thoracic mobility exercises per patient tolerance.   ? PT Home Exercise Plan [email or web address]   ? Consulted and Agree with Plan of Care Patient   ? ?  ?  ? ?  ? ? ?Patient will benefit from skilled therapeutic intervention in order to improve the following deficits and impairments:  Obesity, Impaired UE functional use, Increased muscle spasms, Pain, Impaired flexibility, Postural dysfunction, Decreased strength ? ?Visit Diagnosis: ?Left cervical radiculopathy ? ?Deficit in activities of daily living (ADL) ? ?Abnormal posture ? ? ? ? ?Problem List ?Patient Active Problem List  ? Diagnosis Date Noted  ? Weight gain 03/11/2009  ? Libido, decreased 10/16/2008  ? Insomnia 05/22/2005  ? Tremor 05/22/2005  ? Reactive airway disease 11/11/2004  ? Headache 04/21/2004  ? Alcohol abuse 05/28/2003  ? 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 Drug abuse (HCC) 05/28/2003  ? Nonspecific elevation of levels of transaminase or lactic acid dehydrogenase (LDH) 05/28/2003  ? Pure hypercholesterolemia 03/16/2003  ? Obesity 03/15/2003  ? Bipolar affective disorder (HCC) 12/22/2001  ? Family history of ischemic heart disease 12/22/2001  ? Hypothyroidism 12/22/2001  ? ? ?Creig Hines, PT ?10/03/2021, 2:27 PM ? ?[place] ?[facility] ?[address] ?[place], [place], [address] ?Phone: [phone]   Fax:  [phone] ? ?Name: Brett Bailey ?MRN: [id number] ?Date of Birth: 1971/05/09 ? ? ?

## 2021-10-03 NOTE — Patient Instructions (Signed)
Access Code: [email or web address] ?URL: [email or web address] ?Date: 10/03/2021 ?Prepared by: Creig Hines ? ?Exercises ?Thoracic Extension Mobilization on Foam Roll - 3-4 x daily - 7 x weekly - 3 sets - 10 reps ?Child's Pose with Thread the Needle - 2-3 x daily - 7 x weekly - 2 sets - 14 reps - 3s hold ?Median Nerve Tensioner - 2-3 x daily - 7 x weekly - 10 reps - 1s hold ? ?

## 2021-10-10 NOTE — Therapy (Signed)
?OUTPATIENT PHYSICAL THERAPY TREATMENT NOTE ? ? ?Patient Name: Brett Bailey ?MRN: [id number] ?DOB:04/30/1971, 51 y.o., male ?Today's Date: 10/13/2021 ? ?PCP: [facility] ?REFERRING PROVIDER: [facility] ? ? PT End of Session - 10/13/21 2694   ? ? Visit Number 2   ? Number of Visits 6   ? Date for PT Re-Evaluation 11/14/21   ? Authorization Type [employer] Managed($50 copay)   ? Authorization Time Period 09/03/2021 - Current   ? Authorization - Visit Number 1   ? Authorization - Number of Visits 20   ? Progress Note Due on Visit 6   ? PT Start Time [id number]   ? PT Stop Time 0900   ? PT Time Calculation (min) 39 min   ? Activity Tolerance Patient tolerated treatment well   ? Behavior During Therapy Brett Bailey for tasks assessed/performed   ? ?  ?  ? ?  ? ? ?Past Medical History:  ?Diagnosis Date  ? Alcoholic (HCC)   ? Thyroid disease   ? ?Past Surgical History:  ?Procedure Laterality Date  ? THYROIDECTOMY    ? ?Patient Active Problem List  ? Diagnosis Date Noted  ? Weight gain 03/11/2009  ? Libido, decreased 10/16/2008  ? Insomnia 05/22/2005  ? Tremor 05/22/2005  ? Reactive airway disease 11/11/2004  ? Headache 04/21/2004  ? Alcohol abuse 05/28/2003  ? 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 Drug abuse (HCC) 05/28/2003  ? Nonspecific elevation of levels of transaminase or lactic acid dehydrogenase (LDH) 05/28/2003  ? Pure hypercholesterolemia 03/16/2003  ? Obesity 03/15/2003  ? Bipolar affective disorder (HCC) 12/22/2001  ? Family history of ischemic heart disease 12/22/2001  ? Hypothyroidism 12/22/2001  ? ? ?REFERRING DIAG: Lt Cervical radiculopathy  ? ?THERAPY DIAG:  ?Left cervical radiculopathy ? ?PERTINENT HISTORY: none ? ?PRECAUTIONS: none ? ?SUBJECTIVE: Patient reports he has not had an opportunity to do his exercises from evaluation due to busy work week.  ? ?PAIN:  ?Are you having pain? No ? ? ? ? ?TODAY'S TREATMENT:  ?Cervical/thoracic extension over a towel in supine 3 x 10 ?Cervical  retractions in supine x 10 ?Thoracic extension in sitting x 10 ?Thoracic rotation x 10 each ?Scapular retractions/rows GTB 2 sets of 10 ?Shoulder extensions GTB 2 sets of 10 ?Seated postural education with lumbar roll ? ?Manual theragun x 9 min to L upper trap, paraspinals, parascapular area; manual therapy performed independent of any other treatment. ? ? ?PATIENT EDUCATION: ?Education details: review of rehab goals set at evaluation and HEP ?Person educated: Patient ?Education method: Explanation ?Education comprehension: verbalized understanding, demonstration ? ? ?HOME EXERCISE PROGRAM: ?Supine cervical extension 10/13/21 seated thoracic extension, rotation ? ? ? ? PT Long Term Goals - 10/03/21 1418   ? ?  ? PT LONG TERM GOAL #1  ? Title Patient will achieve a FOTO score of at least 71% function.   ? Time 6   ? Period Weeks   ? Status New   ? Target Date 11/14/21   ?  ? PT LONG TERM GOAL #2  ? Title Patient will be able to achieve cervical end ROM for all motions without an onset of pain/pressure.   ? Time 6   ? Period Weeks   ? Status New   ? Target Date 11/14/21   ?  ? PT LONG TERM GOAL #3  ? Title Patient will report being able to self monitor and correct his sitting and standing posture for greater than half of  each day.   ? Time 6   ? Period Weeks   ? Status New   ? Target Date 11/14/21   ?  ? PT LONG TERM GOAL #4  ? Title Patient will be independent with his HEP.   ? Time 6   ? Period Weeks   ? Status New   ? Target Date 11/14/21   ? ?  ?  ? ?  ? ? ? ?Plan - 10/13/21 1421   ?  ?  Clinical Impression Statement Session today focused on review of rehab goals set at evaluation visit and review of HEP. Therapist progressed patient's HEP adding thoracic extension and rotation in sitting.  Therapist also educated patient on good sitting posture utilizing a towel roll/lumbar roll as he continues to demonstrate forward head and rounded shoulders and sits quite a bit at work.  Patient continues with tightness Left  side cervical and paraspinal muscles so added manual soft tissue work with theragun to address.  ?Patient will benefit from continued skilled physical therapy in order to improve function and reduce impairment.   ?  Personal Factors and Comorbidities Comorbidity 3+   ?  Comorbidities thyroid disease, obesity, alcoholism, back pain   ?  Examination-Activity Limitations Sleep;Sit;Stand;Lift   ?  Stability/Clinical Decision Making Stable/Uncomplicated   ?  Clinical Decision Making Low   ?  Rehab Potential Fair   ?  PT Frequency 1x / week   ?  PT Duration 6 weeks   ?  PT Treatment/Interventions ADLs/Self Care Home Management;Cryotherapy;Electrical Stimulation;Iontophoresis 4mg /ml Dexamethasone;Moist Heat;Traction;Neuromuscular re-education;Therapeutic exercise;Therapeutic activities;Patient/family education;Manual techniques;Dry needling;Spinal Manipulations   ?  PT Next Visit Plan Review HEP and add more Thoracic mobility exercises per patient tolerance.   ?  PT Home Exercise Plan [email or web address]   ? ? ? ? ?9:05 AM, 10/13/21 ?Brett Bailey MPT ?[facility] ?Brett Bailey [phone] ?Ph:[phone] ? ?

## 2021-10-13 ENCOUNTER — Ambulatory Visit (HOSPITAL_COMMUNITY): Payer: Managed Care, Other (non HMO)

## 2021-10-13 ENCOUNTER — Other Ambulatory Visit: Payer: Self-pay

## 2021-10-13 DIAGNOSIS — R252 Cramp and spasm: Secondary | ICD-10-CM

## 2021-10-13 DIAGNOSIS — M5412 Radiculopathy, cervical region: Secondary | ICD-10-CM | POA: Diagnosis not present

## 2021-10-13 DIAGNOSIS — Z789 Other specified health status: Secondary | ICD-10-CM

## 2021-10-13 DIAGNOSIS — R293 Abnormal posture: Secondary | ICD-10-CM

## 2021-10-17 NOTE — Therapy (Incomplete)
?OUTPATIENT PHYSICAL THERAPY TREATMENT NOTE ? ? ?Patient Name: Brett Bailey ?MRN: [id number] ?DOB:[birth year], 51 y.o., male ?Today's Date: 10/17/2021 ? ?PCP: [facility] ?REFERRING PROVIDER: [facility] ? ? ? ? ?Past Medical History:  ?Diagnosis Date  ? Alcoholic ([place])   ? Thyroid disease   ? ?Past Surgical History:  ?Procedure Laterality Date  ? THYROIDECTOMY    ? ?Patient Active Problem List  ? Diagnosis Date Noted  ? Weight gain 03/11/2009  ? Libido, decreased 10/16/2008  ? Insomnia 05/22/2005  ? Tremor 05/22/2005  ? Reactive airway disease 11/11/2004  ? Headache 04/21/2004  ? Alcohol abuse 05/28/2003  ? 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 Drug abuse ([place]) 05/28/2003  ? Nonspecific elevation of levels of transaminase or lactic acid dehydrogenase (LDH) 05/28/2003  ? Pure hypercholesterolemia 03/16/2003  ? Obesity 03/15/2003  ? Bipolar affective disorder ([place]) 12/22/2001  ? Family history of ischemic heart disease 12/22/2001  ? Hypothyroidism 12/22/2001  ? ? ?REFERRING DIAG: Lt Cervical radiculopathy  ? ?THERAPY DIAG:  ?Left cervical radiculopathy ? ?PERTINENT HISTORY: none ? ?PRECAUTIONS: none ? ?SUBJECTIVE: Patient reports he has not had an opportunity to do his exercises from evaluation due to busy work week.  ? ?PAIN:  ?Are you having pain? No ? ? ? ? ?TODAY'S TREATMENT:  ?Cervical/thoracic extension over a towel in supine 3 x 10 ?Cervical retractions in supine x 10 ?Thoracic extension in sitting x 10 ?Thoracic rotation x 10 each ?Scapular retractions/rows GTB 2 sets of 10 ?Shoulder extensions GTB 2 sets of 10 ?Seated postural education with lumbar roll ? ?Manual theragun x 9 min to L upper trap, paraspinals, parascapular area; manual therapy performed independent of any other treatment. ? ? ?PATIENT EDUCATION: ?Education details: review of rehab goals set at evaluation and HEP ?Person educated: Patient ?Education method: Explanation ?Education comprehension:  verbalized understanding, demonstration ? ? ?HOME EXERCISE PROGRAM: ?Supine cervical extension 10/13/21 seated thoracic extension, rotation ? ? ? ? PT Long Term Goals - 10/03/21 1418   ? ?  ? PT LONG TERM GOAL #1  ? Title Patient will achieve a FOTO score of at least 71% function.   ? Time 6   ? Period Weeks   ? Status New   ? Target Date 11/14/21   ?  ? PT LONG TERM GOAL #2  ? Title Patient will be able to achieve cervical end ROM for all motions without an onset of pain/pressure.   ? Time 6   ? Period Weeks   ? Status New   ? Target Date 11/14/21   ?  ? PT LONG TERM GOAL #3  ? Title Patient will report being able to self monitor and correct his sitting and standing posture for greater than half of each day.   ? Time 6   ? Period Weeks   ? Status New   ? Target Date 11/14/21   ?  ? PT LONG TERM GOAL #4  ? Title Patient will be independent with his HEP.   ? Time 6   ? Period Weeks   ? Status New   ? Target Date 11/14/21   ? ?  ?  ? ?  ? ? ? ?Plan - 10/13/21 1421   ?  ?  Clinical Impression Statement Session today focused on review of rehab goals set at evaluation visit and review of HEP. Therapist progressed patient's HEP adding thoracic extension and rotation in sitting.  Therapist also educated patient on good  sitting posture utilizing a towel roll/lumbar roll as he continues to demonstrate forward head and rounded shoulders and sits quite a bit at work.  Patient continues with tightness Left side cervical and paraspinal muscles so added manual soft tissue work with theragun to address.  ?Patient will benefit from continued skilled physical therapy in order to improve function and reduce impairment.   ?  Personal Factors and Comorbidities Comorbidity 3+   ?  Comorbidities thyroid disease, obesity, alcoholism, back pain   ?  Examination-Activity Limitations Sleep;Sit;Stand;Lift   ?  Stability/Clinical Decision Making Stable/Uncomplicated   ?  Clinical Decision Making Low   ?  Rehab Potential Fair   ?  PT Frequency  1x / week   ?  PT Duration 6 weeks   ?  PT Treatment/Interventions ADLs/Self Care Home Management;Cryotherapy;Electrical Stimulation;Iontophoresis 4mg /ml Dexamethasone;Moist Heat;Traction;Neuromuscular re-education;Therapeutic exercise;Therapeutic activities;Patient/family education;Manual techniques;Dry needling;Spinal Manipulations   ?  PT Next Visit Plan Review HEP and add more Thoracic mobility exercises per patient tolerance.   ?  PT Home Exercise Plan [email or web address]   ? ? ? ? ?8:08 AM, 10/17/21 ?Taryll Reichenberger Small Cortavius Montesinos MPT ?[facility] ?[place] [phone] ?[phone] ? ?

## 2021-10-20 ENCOUNTER — Encounter (HOSPITAL_COMMUNITY): Payer: Managed Care, Other (non HMO)

## 2021-10-20 ENCOUNTER — Telehealth (HOSPITAL_COMMUNITY): Payer: Self-pay

## 2021-10-20 NOTE — Telephone Encounter (Signed)
Called and left message on patient's personal voicemail regarding missed visit this morning and next appointment scheduled for Friday. ? ?8:55 AM, 10/20/21 ?Brett Bailey MPT ?[facility] ?[place] [phone] ?Ph:[phone] ? ?

## 2021-10-31 ENCOUNTER — Encounter (HOSPITAL_COMMUNITY): Payer: Self-pay | Admitting: Physical Therapy

## 2021-10-31 ENCOUNTER — Ambulatory Visit (HOSPITAL_COMMUNITY): Payer: Managed Care, Other (non HMO) | Admitting: Physical Therapy

## 2021-10-31 ENCOUNTER — Encounter (HOSPITAL_COMMUNITY): Payer: Managed Care, Other (non HMO) | Admitting: Physical Therapy

## 2021-10-31 ENCOUNTER — Telehealth (HOSPITAL_COMMUNITY): Payer: Self-pay

## 2021-10-31 DIAGNOSIS — R252 Cramp and spasm: Secondary | ICD-10-CM

## 2021-10-31 DIAGNOSIS — M5412 Radiculopathy, cervical region: Secondary | ICD-10-CM

## 2021-10-31 DIAGNOSIS — Z789 Other specified health status: Secondary | ICD-10-CM

## 2021-10-31 DIAGNOSIS — R293 Abnormal posture: Secondary | ICD-10-CM

## 2021-10-31 NOTE — Telephone Encounter (Signed)
He will not be here Monday - he has a schedule conflict. ?

## 2021-10-31 NOTE — Therapy (Signed)
?OUTPATIENT PHYSICAL THERAPY TREATMENT NOTE ? ? ?Patient Name: Brett Bailey ?MRN: [id number] ?DOB:[birth year], 51 y.o., male ?Today's Date: 10/31/2021 ? ?PCP: [facility] ?REFERRING PROVIDER: [facility] ? ? PT End of Session - 10/31/21 1038   ? ? Visit Number 3   ? Number of Visits 6   ? Date for PT Re-Evaluation 11/14/21   ? Authorization Type [employer] Managed($50 copay)   ? Authorization Time Period 09/03/2021 - Current   ? Authorization - Visit Number 3   ? Authorization - Number of Visits 20   ? Progress Note Due on Visit 6   ? PT Start Time 1035   ? PT Stop Time 1113   ? PT Time Calculation (min) 38 min   ? Activity Tolerance Patient tolerated treatment well   ? Behavior During Therapy [facility] for tasks assessed/performed   ? ?  ?  ? ?  ? ? ?Past Medical History:  ?Diagnosis Date  ? Alcoholic ([place])   ? Thyroid disease   ? ?Past Surgical History:  ?Procedure Laterality Date  ? THYROIDECTOMY    ? ?Patient Active Problem List  ? Diagnosis Date Noted  ? Weight gain 03/11/2009  ? Libido, decreased 10/16/2008  ? Insomnia 05/22/2005  ? Tremor 05/22/2005  ? Reactive airway disease 11/11/2004  ? Headache 04/21/2004  ? Alcohol abuse 05/28/2003  ? 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 Drug abuse ([place]) 05/28/2003  ? Nonspecific elevation of levels of transaminase or lactic acid dehydrogenase (LDH) 05/28/2003  ? Pure hypercholesterolemia 03/16/2003  ? Obesity 03/15/2003  ? Bipolar affective disorder ([place]) 12/22/2001  ? Family history of ischemic heart disease 12/22/2001  ? Hypothyroidism 12/22/2001  ? ? ?REFERRING DIAG: Lt Cervical radiculopathy  ? ?THERAPY DIAG:  ?Left cervical radiculopathy ? ?Deficit in activities of daily living (ADL) ? ?Abnormal posture ? ?Radiculopathy, cervical region ? ?Cramp and spasm ? ?PERTINENT HISTORY: none ? ?PRECAUTIONS: none ? ?SUBJECTIVE: Patient reports that the past few weeks have been the best he has had in a while in regard to pain level ? ?PAIN:  ?Are  you having pain? No ? ? ?OBJECTIVE: ?TODAY'S TREATMENT:  ?10/31/21: ?Aerobic: ?-Armbike x51min each ?Manual: ?-Supine bilateral pec stretch 10x20s holds ?Stretch: ?-Seated UT stretch 3x15s Lt side ?Prone: ?-Prone Ts 1x8 each ?-Prone Ys 1x8 each ?Seated: ?-Row(Body Craft) w/ scapular retraction bias #2 3x6 ? ?10/13/21: ?Cervical/thoracic extension over a towel in supine 3 x 10 ?Cervical retractions in supine x 10 ?Thoracic extension in sitting x 10 ?Thoracic rotation x 10 each ?Scapular retractions/rows GTB 2 sets of 10 ?Shoulder extensions GTB 2 sets of 10 ?Seated postural education with lumbar roll ?  ?Manual theragun x 9 min to L upper trap, paraspinals, parascapular area; manual therapy performed independent of any other treatment. ?  ?  ?PATIENT EDUCATION: ?Education details: review of rehab goals set at evaluation and HEP ?Person educated: Patient ?Education method: Explanation ?Education comprehension: verbalized understanding, demonstration ?  ?  ?HOME EXERCISE PROGRAM: ?Supine cervical extension 10/13/21 seated thoracic extension, rotation, seated UT stretch, prone T, and prone Y ?  ?  ?  ?  PT Long Term Goals - 10/03/21 1418   ?  ?    ?     ?  PT LONG TERM GOAL #1  ?  Title Patient will achieve a FOTO score of at least 71% function.   ?  Time 6   ?  Period Weeks   ?  Status New   ?  Target Date 11/14/21   ?     ?  PT LONG TERM GOAL #2  ?  Title Patient will be able to achieve cervical end ROM for all motions without an onset of pain/pressure.   ?  Time 6   ?  Period Weeks   ?  Status New   ?  Target Date 11/14/21   ?     ?  PT LONG TERM GOAL #3  ?  Title Patient will report being able to self monitor and correct his sitting and standing posture for greater than half of each day.   ?  Time 6   ?  Period Weeks   ?  Status New   ?  Target Date 11/14/21   ?     ?  PT LONG TERM GOAL #4  ?  Title Patient will be independent with his HEP.   ?  Time 6   ?  Period Weeks   ?  Status New   ?  Target Date 11/14/21   ?  ?    ?  ?  ?   ?  ?  ?  ?Plan - 10/13/21 1421   ?  ?  Clinical Impression Statement Patient tolerated treatment well during today's session although he did struggle with the scapular coordination needed in order to perform the row exercise with proper form. There was also a difference in the AROM of the Lt side Prone T as well as the Rt side Prone Y exercise height with both of these being less than the other side.  ?  Personal Factors and Comorbidities Comorbidity 3+   ?  Comorbidities thyroid disease, obesity, alcoholism, back pain   ?  Examination-Activity Limitations Sleep;Sit;Stand;Lift   ?  Stability/Clinical Decision Making Stable/Uncomplicated   ?  Clinical Decision Making Low   ?  Rehab Potential Fair   ?  PT Frequency 1x / week   ?  PT Duration 6 weeks   ?  PT Treatment/Interventions ADLs/Self Care Home Management;Cryotherapy;Electrical Stimulation;Iontophoresis 4mg /ml Dexamethasone;Moist Heat;Traction;Neuromuscular re-education;Therapeutic exercise;Therapeutic activities;Patient/family education;Manual techniques;Dry needling;Spinal Manipulations   ?  PT Next Visit Plan Cancel Monday's appointment and have the Friday appointment be the day of discharge assuming no regression occurs.  ?  PT Home Exercise Plan [email or web address]   ? ? ? ?Adalberto Cole, PT ?10/31/2021, 12:58 PM ? ?  ? ?

## 2021-11-03 ENCOUNTER — Encounter (HOSPITAL_COMMUNITY): Payer: Managed Care, Other (non HMO) | Admitting: Physical Therapy

## 2021-11-14 ENCOUNTER — Encounter (HOSPITAL_COMMUNITY): Payer: Managed Care, Other (non HMO)

## 2021-11-17 ENCOUNTER — Encounter (HOSPITAL_COMMUNITY): Payer: Managed Care, Other (non HMO)

## 2021-11-17 ENCOUNTER — Telehealth (HOSPITAL_COMMUNITY): Payer: Self-pay

## 2021-11-17 NOTE — Telephone Encounter (Signed)
Called and left message for patient on voicemail regarding missed appointment today and discharge due to no show policy; this is his 3rd no show so we well discharge patient according to our no-show policy  ? ?8:49 AM, 11/17/21 ?Tavia Stave Small Carlota Philley MPT ?[facility] ?[place] [phone] ?Ph:[phone] ? ?

## 2021-12-31 ENCOUNTER — Encounter (HOSPITAL_COMMUNITY): Payer: Self-pay

## 2021-12-31 NOTE — Therapy (Addendum)
PHYSICAL THERAPY DISCHARGE SUMMARY  Visits from Start of Care: 3  Current functional level related to goals / functional outcomes: Unknown patient did not keep follow up appointments   Remaining deficits: unknown   Education / Equipment: HEP   Patient agrees to discharge. Patient goals were not met. Patient is being discharged due to not returning since the last visit 10/31/2021.   10:57 AM, 12/31/21 Michaeleen Down Small Karey Stucki MPT [facility] [place] [phone]

## 2021-12-31 NOTE — Unmapped (Signed)
Formatting of this note might be different from the original.  PHYSICAL THERAPY DISCHARGE SUMMARY    Visits from Start of Care: 3    Current functional level related to goals / functional outcomes:  Unknown patient did not keep follow up appointments    Remaining deficits:  unknown    Education / Equipment:  HEP     Patient agrees to discharge. Patient goals were not met. Patient is being discharged due to not returning since the last visit 10/31/2021.    10:57 AM, 12/31/21  Amy Small Lynch MPT  [facility]  [place] [id number]  [phone]    Electronically signed by Ardis Rowan, PT at 12/31/2021 10:57 AM EDT

## 2022-10-07 ENCOUNTER — Inpatient Hospital Stay
Admit: 2022-10-07 | Discharge: 2022-10-07 | Disposition: A | Discharge status: Home / Self Care | Payer: PRIVATE HEALTH INSURANCE | Attending: Emergency Medicine

## 2022-10-07 DIAGNOSIS — I480 Paroxysmal atrial fibrillation: Secondary | ICD-10-CM

## 2022-10-07 LAB — COMPREHENSIVE METABOLIC PANEL
ALT: 31 U/L (ref 12–65)
AST: 40 U/L — ABNORMAL HIGH (ref 15–37)
Albumin/Globulin Ratio: 0.9 (ref 0.4–1.6)
Albumin: 3.5 g/dL (ref 3.5–5.0)
Alk Phosphatase: 68 U/L (ref 50–136)
Anion Gap: 7 mmol/L (ref 2–11)
BUN: 13 MG/DL (ref 6–23)
CO2: 21 mmol/L (ref 21–32)
Calcium: 9.2 MG/DL (ref 8.3–10.4)
Chloride: 110 mmol/L (ref 103–113)
Creatinine: 0.86 MG/DL (ref 0.8–1.5)
Est, Glom Filt Rate: >60 mL/min/{1.73_m2} (ref >60)
Globulin: 3.8 g/dL (ref 2.8–4.5)
Glucose: 117 mg/dL — ABNORMAL HIGH (ref 65–100)
Potassium: 5.2 mmol/L — ABNORMAL HIGH (ref 3.5–5.1)
Sodium: 138 mmol/L (ref 136–146)
Total Bilirubin: 2.8 MG/DL — ABNORMAL HIGH (ref 0.2–1.1)
Total Protein: 7.3 g/dL (ref 6.3–8.2)

## 2022-10-07 LAB — CBC WITH AUTO DIFFERENTIAL
Absolute Immature Granulocyte: 0 10*3/uL (ref 0.0–0.5)
Basophils %: 0 % (ref 0.0–2.0)
Basophils Absolute: 0 10*3/uL (ref 0.0–0.2)
Eosinophils %: 1 % (ref 0.5–7.8)
Eosinophils Absolute: 0.1 10*3/uL (ref 0.0–0.8)
Hematocrit: 46.4 % (ref 41.1–50.3)
Hemoglobin: 17 g/dL (ref 13.6–17.2)
Immature Granulocytes: 0 % (ref 0.0–5.0)
Lymphocytes %: 20 % (ref 13–44)
Lymphocytes Absolute: 1.9 10*3/uL (ref 0.5–4.6)
MCH: 33.7 PG — ABNORMAL HIGH (ref 26.1–32.9)
MCHC: 36.6 g/dL — ABNORMAL HIGH (ref 31.4–35.0)
MCV: 92.1 FL (ref 82.0–102.0)
MPV: 10.6 FL (ref 9.4–12.3)
Monocytes %: 10 % (ref 4.0–12.0)
Monocytes Absolute: 1 10*3/uL (ref 0.1–1.3)
Neutrophils %: 68 % (ref 43–78)
Neutrophils Absolute: 6.3 10*3/uL (ref 1.7–8.2)
Platelets: 219 10*3/uL (ref 150–450)
RBC: 5.04 M/uL (ref 4.23–5.6)
RDW: 13.5 % (ref 11.9–14.6)
WBC: 9.2 10*3/uL (ref 4.3–11.1)
nRBC: 0 10*3/uL (ref 0.0–0.2)

## 2022-10-07 LAB — EKG 12-LEAD
Atrial Rate: 375 {beats}/min
Q-T Interval: 298 ms
QRS Duration: 80 ms
QTc Calculation (Bazett): 419 ms
R Axis: -6 degrees
T Axis: 24 degrees
Ventricular Rate: 119 {beats}/min

## 2022-10-07 LAB — ETHANOL: Ethanol Lvl: 21 MG/DL

## 2022-10-07 LAB — MAGNESIUM: Magnesium: 2 mg/dL (ref 1.8–2.4)

## 2022-10-07 LAB — TSH WITH REFLEX: TSH w Free Thyroid if Abnormal: 0.91 u[IU]/mL (ref 0.358–3.740)

## 2022-10-07 MED ORDER — METOPROLOL TARTRATE 25 MG PO TABS
25 | ORAL | Status: AC
Start: 2022-10-07 — End: 2022-10-07
  Administered 2022-10-07: 17:00:00 25 mg via ORAL

## 2022-10-07 MED ORDER — DILTIAZEM HCL 25 MG/5ML IV SOLN
25 | INTRAVENOUS | Status: DC
Start: 2022-10-07 — End: 2022-10-07

## 2022-10-07 MED ORDER — METOPROLOL TARTRATE 25 MG PO TABS
25 | ORAL_TABLET | Freq: Two times a day (BID) | ORAL | 0 refills | Status: DC
Start: 2022-10-07 — End: 2022-10-07

## 2022-10-07 MED ORDER — MAGNESIUM SULFATE 2000 MG/50 ML IVPB PREMIX
2 | INTRAVENOUS | Status: AC
Start: 2022-10-07 — End: 2022-10-07
  Administered 2022-10-07: 17:00:00 2000 mg via INTRAVENOUS

## 2022-10-07 MED ORDER — SODIUM CHLORIDE 0.9 % IV BOLUS
0.9 | INTRAVENOUS | Status: AC
Start: 2022-10-07 — End: 2022-10-07
  Administered 2022-10-07: 16:00:00 1000 mL via INTRAVENOUS

## 2022-10-07 MED FILL — METOPROLOL TARTRATE 25 MG PO TABS: 25 MG | ORAL | Qty: 1

## 2022-10-07 MED FILL — MAGNESIUM SULFATE 2 GM/50ML IV SOLN: 2 GM/50ML | INTRAVENOUS | Qty: 50

## 2022-10-07 MED FILL — DILTIAZEM HCL 25 MG/5ML IV SOLN: 25 MG/5ML | INTRAVENOUS | Qty: 5

## 2022-10-07 NOTE — ED Provider Notes (Addendum)
Emergency Department Provider Note       PCP: No primary care provider on file.   Age: 52 y.o.   Sex: male     [place]    1. Paroxysmal atrial fibrillation (HCC)  I48.0           Medical Decision Making     11:32 AM EST  Pt spontaneously converted to NSR prior to dilt administration.  Will give 25 mg of oral metoprolol.  Monitor the patient in ED for another hour or so to ensure he remains in sinus rhythm and then plan to discharge on metoprolol with follow-up with cardiology.  He is low risk for stroke given his CHA2DS2-VASc score and therefore we will not anticoagulate at this time.    Pt seen by case management given drinking cessation resources in his home town.    He is on propranolol I discussed this with cardiology and they recommend continuing the propranolol no reason to discontinue to start metoprolol.     1 or more acute illnesses that pose a threat to life or bodily function.   Prescription drug management performed.    I independently ordered and reviewed each unique test.  I reviewed external records: ED visit note from an outside group.  I reviewed external records: provider visit note from outside specialist.       My Independent EKG Interpretation: atrial fibrillation      ST Segments:Normal ST segments - NO STEMI   Rate: 119    REPEAT EKG  My independent EKG interpretation: Normal sinus rhythm, rate of 82, no ST segment changes or T wave inversions, no STEMI            History     52 year old male presented emergency department complaint of palpitations.  Patient states around 330 this morning he woke up having a sensation of extra heartbeats and some and slight pauses.  He denies chest pain or shortness of breath.  The patient does admit to heavy alcohol use.  States he drinks a pint to 1/5 of alcohol a day.  He does state he had a drink this morning.  He also notes that he has had an episode of this once prior approximately 24 hours ago that lasted about 2 hours.  He  states that it started and resolved spontaneously.  He also notes that he had similar symptoms to this once a couple of years ago when he was diagnosed with pericarditis was treated and has been fine since then.  He is visiting from out of town.  He states he is been to inpatient rehab once prior 8 years ago.          ROS     Review of Systems     Physical Exam     Vitals signs and nursing note reviewed:  Vitals:    10/07/22 1025 10/07/22 1121   BP: 109/78 118/77   Pulse: 89 83   Resp: 18 15   Temp: 97.9 F (36.6 C)    TempSrc: Oral    SpO2: 100% 100%      Physical Exam  Vitals and nursing note reviewed.   Constitutional:       General: He is not in acute distress.     Appearance: Normal appearance. He is normal weight. He is not ill-appearing or toxic-appearing.   HENT:      Head: Normocephalic and atraumatic.      Mouth/Throat:  Mouth: Mucous membranes are moist.   Eyes:      Extraocular Movements: Extraocular movements intact.   Cardiovascular:      Rate and Rhythm: Tachycardia present. Rhythm irregular.      Pulses: Normal pulses.      Heart sounds: Normal heart sounds.   Pulmonary:      Effort: Pulmonary effort is normal. No respiratory distress.   Abdominal:      General: There is no distension.      Palpations: Abdomen is soft.      Tenderness: There is no abdominal tenderness.   Musculoskeletal:      Cervical back: Normal range of motion and neck supple.   Skin:     General: Skin is dry.      Findings: No rash.   Neurological:      General: No focal deficit present.      Mental Status: He is alert and oriented to person, place, and time. Mental status is at baseline.   Psychiatric:         Mood and Affect: Mood normal.         Behavior: Behavior normal.        Procedures     Procedures    Orders Placed This Encounter   Procedures    Magnesium    CMP    CBC with Auto Differential    ETOH    EKG 12 Lead    Saline lock IV        Medications given during this emergency department visit:  Medications    sodium chloride 0.9 % bolus 1,000 mL (1,000 mLs IntraVENous New Bag 10/07/22 1122)   magnesium sulfate 2000 mg in 50 mL IVPB premix (2,000 mg IntraVENous New Bag 10/07/22 1130)   dilTIAZem injection 10 mg (0 mg IntraVENous Held 10/07/22 1123)   metoprolol tartrate (LOPRESSOR) tablet 25 mg (has no administration in time range)       New Prescriptions    METOPROLOL TARTRATE (LOPRESSOR) 25 MG TABLET    Take 1 tablet by mouth 2 times daily        Past Medical History:   Diagnosis Date    Alcoholism ([place])     Hyperlipidemia         Past Surgical History:   Procedure Laterality Date    THYROIDECTOMY          Social History     Socioeconomic History    Marital status: Married     Spouse name: None    Number of children: None    Years of education: None    Highest education level: None   Substance and Sexual Activity    Alcohol use: Yes        Previous Medications    LEVOTHYROXINE (SYNTHROID) 50 MCG TABLET    Take 1 tablet by mouth Daily    LIOTHYRONINE (CYTOMEL) 25 MCG TABLET    Take 1 tablet by mouth daily    SIMVASTATIN (ZOCOR) 10 MG TABLET    Take 1 tablet by mouth nightly        Results for orders placed or performed during the hospital encounter of 10/07/22   Magnesium   Result Value Ref Range    Magnesium 2.0 1.8 - 2.4 mg/dL   CMP   Result Value Ref Range    Sodium 138 136 - 146 mmol/L    Potassium 5.2 (H) 3.5 - 5.1 mmol/L    Chloride 110 103 - 113 mmol/L  CO2 21 21 - 32 mmol/L    Anion Gap 7 2 - 11 mmol/L    Glucose 117 (H) 65 - 100 mg/dL    BUN 13 6 - 23 MG/DL    Creatinine 0.86 0.8 - 1.5 MG/DL    Est, Glom Filt Rate >60 >60 ml/min/1.11m    Calcium 9.2 8.3 - 10.4 MG/DL    Total Bilirubin 2.8 (H) 0.2 - 1.1 MG/DL    ALT 31 12 - 65 U/L    AST 40 (H) 15 - 37 U/L    Alk Phosphatase 68 50 - 136 U/L    Total Protein 7.3 6.3 - 8.2 g/dL    Albumin 3.5 3.5 - 5.0 g/dL    Globulin 3.8 2.8 - 4.5 g/dL    Albumin/Globulin Ratio 0.9 0.4 - 1.6     CBC with Auto Differential   Result Value Ref Range    WBC 9.2 4.3 - 11.1 K/uL    RBC  5.04 4.23 - 5.6 M/uL    Hemoglobin 17.0 13.6 - 17.2 g/dL    Hematocrit 46.4 41.1 - 50.3 %    MCV 92.1 82.0 - 102.0 FL    MCH 33.7 (H) 26.1 - 32.9 PG    MCHC 36.6 (H) 31.4 - 35.0 g/dL    RDW 13.5 11.9 - 14.6 %    Platelets 219 150 - 450 K/uL    MPV 10.6 9.4 - 12.3 FL    nRBC 0.00 0.0 - 0.2 K/uL    Differential Type AUTOMATED      Neutrophils % 68 43 - 78 %    Lymphocytes % 20 13 - 44 %    Monocytes % 10 4.0 - 12.0 %    Eosinophils % 1 0.5 - 7.8 %    Basophils % 0 0.0 - 2.0 %    Immature Granulocytes 0 0.0 - 5.0 %    Neutrophils Absolute 6.3 1.7 - 8.2 K/UL    Lymphocytes Absolute 1.9 0.5 - 4.6 K/UL    Monocytes Absolute 1.0 0.1 - 1.3 K/UL    Eosinophils Absolute 0.1 0.0 - 0.8 K/UL    Basophils Absolute 0.0 0.0 - 0.2 K/UL    Absolute Immature Granulocyte 0.0 0.0 - 0.5 K/UL   ETOH   Result Value Ref Range    Ethanol Lvl 21 MG/DL         No orders to display                No results for input(s): "COVID19" in the last 72 hours.    Voice dictation software was used during the making of this note.  This software is not perfect and grammatical and other typographical errors may be present.  This note has not been completely proofread for errors.     BLinus Mako MD  10/07/22 1138       BLinus Mako MD  10/07/22 1144       BLinus Mako MD  10/07/22 1243

## 2022-10-07 NOTE — ED Notes (Signed)
I have reviewed discharge instructions with the patient.  The patient verbalized understanding.    Patient left ED via Discharge Method: ambulatory to Home with self/     Opportunity for questions and clarification provided.       Patient given 0 scripts.         To continue your aftercare when you leave the hospital, you may receive an automated call from our care team to check in on how you are doing.  This is a free service and part of our promise to provide the best care and service to meet your aftercare needs." If you have questions, or wish to unsubscribe from this service please call [phone].  Thank you for Choosing our [facility] Emergency Department.

## 2022-10-07 NOTE — Care Coordination-Inpatient (Signed)
Chart reviewed, patient from [place] with a hx of ETOH addictions and is agreeable to receiving resources. SW met with the patient in private, provided verbal education on programs available in [place] as well a list of recovery centers in [place]. All questions answered, no additional needs.     Bonita Quin, LMSW  Social Work Case Chiropractor. [facility]

## 2022-10-07 NOTE — Discharge Instructions (Addendum)
Buy magnesium glycinate '100mg'$ . Take this daily to help prevent a fib return.    Continue taking your propranolol.    It is important that you try to stop drinking as this will continue to increase your risk of atrial fibrillation

## 2022-10-07 NOTE — ED Triage Notes (Signed)
Pt states that since about 0300 this morning he started having a fluttering in his chest. Pt states he had another episode of it about 3 days ago. Pt denies pain or shortness of breath.  Pt states he had a similar feeling when he had pericarditis previously.

## 2022-10-10 LAB — EKG 12-LEAD
Atrial Rate: 375 {beats}/min
Q-T Interval: 298 ms
QRS Duration: 80 ms
QTc Calculation (Bazett): 419 ms
R Axis: -6 degrees
T Axis: 24 degrees
Ventricular Rate: 119 {beats}/min

## 2022-10-13 ENCOUNTER — Telehealth: Payer: Self-pay | Admitting: Cardiology

## 2022-10-13 NOTE — Telephone Encounter (Signed)
Patient c/o Palpitations:  High priority if patient c/o lightheadedness, shortness of breath, or chest pain  How long have you had palpitations/irregular HR/ Afib? Are you having the symptoms now?  Patient states he has gone into afib about 3 times in the past week, lasts about 5 hours No symptoms currently  Are you currently experiencing lightheadedness, SOB or CP? No   Do you have a history of afib (atrial fibrillation) or irregular heart rhythm?  No   Have you checked your BP or HR? (document readings if available):  110/70 last week in the ED HR ranges 70-80 when not in afib  Are you experiencing any other symptoms?  CP/tightness

## 2022-10-13 NOTE — Telephone Encounter (Signed)
Patient states that he has been in Afib at least 3 times in the last 10 days. States that the first time, it woke him up in the middle of the night and lasted for several hours. States that he was out of town last Wednesday and it started again which made him go to the ER in [place], [place]. States that he was started on magnesium 100 mg. States that last night, it happened again and last for several hours. He went back to the ER but once he got in the parking lot, he jumped back into rhythm. States that when he has these episodes, the chest pains are intermittent and are usually a 2/10 but the pain is in the left center of his chest. States that there is some tightness and he has been a little lightheaded when being physical. States that sometimes when he coughs, he has a pain above his right eye which may not be related to what is going on. Appointment has been scheduled with Brett Bailey on Friday, 10/16/22 @ 2:30 pm.

## 2022-10-16 ENCOUNTER — Ambulatory Visit: Payer: 59 | Attending: Nurse Practitioner | Admitting: Nurse Practitioner

## 2022-10-16 ENCOUNTER — Encounter: Payer: Self-pay | Admitting: Nurse Practitioner

## 2022-10-16 VITALS — BP 120/80 | HR 68 | Ht 71.0 in | Wt 260.2 lb

## 2022-10-16 DIAGNOSIS — Z8679 Personal history of other diseases of the circulatory system: Secondary | ICD-10-CM | POA: Diagnosis not present

## 2022-10-16 DIAGNOSIS — F1010 Alcohol abuse, uncomplicated: Secondary | ICD-10-CM

## 2022-10-16 DIAGNOSIS — R002 Palpitations: Secondary | ICD-10-CM

## 2022-10-16 DIAGNOSIS — R079 Chest pain, unspecified: Secondary | ICD-10-CM

## 2022-10-16 DIAGNOSIS — E875 Hyperkalemia: Secondary | ICD-10-CM

## 2022-10-16 DIAGNOSIS — Z79899 Other long term (current) drug therapy: Secondary | ICD-10-CM

## 2022-10-16 DIAGNOSIS — I480 Paroxysmal atrial fibrillation: Secondary | ICD-10-CM | POA: Diagnosis not present

## 2022-10-16 DIAGNOSIS — E669 Obesity, unspecified: Secondary | ICD-10-CM

## 2022-10-16 MED ORDER — DILTIAZEM HCL 30 MG PO TABS: 30.0000 mg | ORAL_TABLET | Freq: Two times a day (BID) | ORAL | 3 refills | Status: DC | PRN

## 2022-10-16 NOTE — Progress Notes (Unsigned)
Office Visit    Patient Name: Brett Bailey Date of Encounter: 10/16/2022  PCP:  [facility]  Cardiologist:  Carlyle Dolly, MD *** Advanced Practice Provider:  No care team member to display Electrophysiologist:  None  {Press F2 to show EP APP, CHF, sleep or structural heart MD               :[id number]  { Click here to update then REFRESH NOTE - MD (PCP) or APP (Team Member)  Change PCP Type for MD, Specialty for APP is either Cardiology or Clinical Cardiac Electrophysiology  :[id number]  Chief Complaint    Brett Bailey is a 52 y.o. male with a hx of chest pain, palpitations, alcoholism, and obesity, who presents today for A-fib evaluation.   Past Medical History    Past Medical History:  Diagnosis Date   Alcoholic ([place])    Thyroid disease    Past Surgical History:  Procedure Laterality Date   THYROIDECTOMY      Allergies  No Known Allergies  History of Present Illness    Brett Bailey is a 52 y.o. male with a PMH as mentioned above.   Last seen by Dr. Harl Bowie in 2021.   ED visit 10/2022 for A-fib.   Contacted our office about A-fib this week.  EKGs/Labs/Other Studies Reviewed:   The following studies were reviewed today: ***  EKG:  EKG is *** ordered today.  The ekg ordered today demonstrates ***  Recent Labs: No results found for requested labs within last 365 days.  Recent Lipid Panel No results found for: "CHOL", "TRIG", "HDL", "CHOLHDL", "VLDL", "LDLCALC", "LDLDIRECT"  Risk Assessment/Calculations:  {Does this patient have ATRIAL FIBRILLATION?:[phone]}  The ASCVD Risk score (Arnett DK, et al., 2019) failed to calculate for the following reasons:   Cannot find a previous HDL lab   Cannot find a previous total cholesterol lab    Home Medications   Current Meds  Medication Sig   cyclobenzaprine (FLEXERIL) 10 MG tablet Take 5-10 mg by mouth 3 (three) times daily as needed.    levothyroxine (SYNTHROID, LEVOTHROID) 50 MCG tablet Take 1 tablet (50 mcg total) by mouth daily before breakfast.   liothyronine (CYTOMEL) 25 MCG tablet Take 1 tablet (25 mcg total) by mouth daily.   propranolol ER (INDERAL LA) 160 MG SR capsule Take 1 capsule (160 mg total) by mouth daily.   simvastatin (ZOCOR) 20 MG tablet Take 1 tablet (20 mg total) by mouth daily at 6 PM.     Review of Systems   ***   All other systems reviewed and are otherwise negative except as noted above.  Physical Exam    VS:  BP 120/80   Pulse 68   Ht 5\' 11"  (1.803 m)   Wt 260 lb 3.2 oz (118 kg)   SpO2 96%   BMI 36.29 kg/m  , BMI Body mass index is 36.29 kg/m.  Wt Readings from Last 3 Encounters:  10/16/22 260 lb 3.2 oz (118 kg)  02/16/20 274 lb (124.3 kg)  01/22/20 275 lb (124.7 kg)     GEN: Well nourished, well developed, in no acute distress. HEENT: normal. Neck: Supple, no JVD, carotid bruits, or masses. Cardiac: ***RRR, no murmurs, rubs, or gallops. No clubbing, cyanosis, edema.  ***Radials/PT 2+ and equal bilaterally.  Respiratory:  ***Respirations regular and unlabored, clear to auscultation bilaterally. GI: Soft, nontender, nondistended. MS: No deformity or atrophy. Skin: Warm and dry, no  rash. Neuro:  Strength and sensation are intact. Psych: Normal affect.  Assessment & Plan    ***  {Are you ordering a CV Procedure (e.g. stress test, cath, DCCV, TEE, etc)?   Press F2        :YC:[id number]      Disposition: Follow up {follow up:[id number]} with Carlyle Dolly, MD or APP.  Signed, Finis Bud, NP 10/16/2022, 2:45 PM [facility]

## 2022-10-16 NOTE — Patient Instructions (Addendum)
Medication Instructions:  Begin Diltiazem (short acting) 30mg  twice a day as needed  Continue all other medications.     Labwork: BMET, Mg - orders given Please do in 1 week   Testing/Procedures: Your physician has requested that you have an echocardiogram. Echocardiography is a painless test that uses sound waves to create images of your heart. It provides your doctor with information about the size and shape of your heart and how well your heart's chambers and valves are working. This procedure takes approximately one hour. There are no restrictions for this procedure. Please do NOT wear cologne, perfume, aftershave, or lotions (deodorant is allowed). Please arrive 15 minutes prior to your appointment time. Office will contact with results via phone, letter or mychart.     Follow-Up: 1 month   Any Other Special Instructions Will Be Listed Below (If Applicable).   If you need a refill on your cardiac medications before your next appointment, please call your pharmacy.

## 2022-10-19 ENCOUNTER — Encounter: Payer: Self-pay | Admitting: Nurse Practitioner

## 2022-11-03 ENCOUNTER — Other Ambulatory Visit: Payer: Self-pay | Admitting: Cardiology

## 2022-11-04 LAB — BASIC METABOLIC PANEL WITH GFR
BUN: 10 mg/dL (ref 7–25)
CO2: 20 mmol/L (ref 20–32)
Calcium: 9.3 mg/dL (ref 8.6–10.3)
Chloride: 108 mmol/L (ref 98–110)
Creat: 0.72 mg/dL (ref 0.60–1.29)
Glucose, Bld: 86 mg/dL (ref 65–99)
Potassium: 4.2 mmol/L (ref 3.5–5.3)
Sodium: 141 mmol/L (ref 135–146)
eGFR: 112 mL/min/{1.73_m2} (ref >=60)

## 2022-11-04 LAB — MAGNESIUM: Magnesium: 2.3 mg/dL (ref 1.5–2.5)

## 2022-11-10 ENCOUNTER — Ambulatory Visit: Payer: 59 | Attending: Nurse Practitioner

## 2022-11-10 DIAGNOSIS — R079 Chest pain, unspecified: Secondary | ICD-10-CM | POA: Diagnosis not present

## 2022-11-10 MED ORDER — PERFLUTREN LIPID MICROSPHERE
1.0000 mL | INTRAVENOUS | Status: AC | PRN
  Administered 2022-11-10: 2 mL via INTRAVENOUS

## 2022-11-11 LAB — ECHOCARDIOGRAM COMPLETE
AR max vel: 2.42 cm2
AV Area VTI: 2.57 cm2
AV Area mean vel: 2.43 cm2
AV Mean grad: 4 mmHg
AV Peak grad: 6.9 mmHg
Ao pk vel: 1.31 m/s
Area-P 1/2: 3.08 cm2
Calc EF: 67.6 %
MV M vel: 2.14 m/s
MV Peak grad: 18.3 mmHg
S' Lateral: 2.9 cm
Single Plane A2C EF: 71.2 %
Single Plane A4C EF: 62.6 %

## 2022-11-16 ENCOUNTER — Encounter: Payer: Self-pay | Admitting: Nurse Practitioner

## 2022-11-16 ENCOUNTER — Ambulatory Visit: Payer: 59 | Attending: Nurse Practitioner | Admitting: Nurse Practitioner

## 2022-11-16 VITALS — BP 104/80 | HR 62 | Ht 71.0 in | Wt 253.8 lb

## 2022-11-16 DIAGNOSIS — I480 Paroxysmal atrial fibrillation: Secondary | ICD-10-CM

## 2022-11-16 DIAGNOSIS — Z8679 Personal history of other diseases of the circulatory system: Secondary | ICD-10-CM | POA: Diagnosis not present

## 2022-11-16 DIAGNOSIS — E669 Obesity, unspecified: Secondary | ICD-10-CM

## 2022-11-16 DIAGNOSIS — F1010 Alcohol abuse, uncomplicated: Secondary | ICD-10-CM

## 2022-11-16 DIAGNOSIS — R002 Palpitations: Secondary | ICD-10-CM

## 2022-11-16 NOTE — Patient Instructions (Signed)

## 2022-11-16 NOTE — Progress Notes (Signed)
Office Visit    Patient Name: Brett Bailey Date of Encounter: 11/16/2022  PCP:  [facility]   [facility]  Cardiologist:  Dina Rich, MD  Advanced Practice Provider:  Sharlene Dory, NP Electrophysiologist:  None   Chief Complaint    Brett Bailey is a 52 y.o. male with a hx of chest pain, palpitations, history of pericarditis, hx of thyroid disease, alcoholism, PAF, and obesity, who presents today for A-fib follow-up evaluation.   Past Medical History    Past Medical History:  Diagnosis Date   Alcoholic    History of chest pain    History of pericarditis    Obesity (BMI 30-39.9)    PAF (paroxysmal atrial fibrillation)    Palpitations    Thyroid disease    Past Surgical History:  Procedure Laterality Date   THYROIDECTOMY      Allergies  No Known Allergies  History of Present Illness    Brett Bailey is a very pleasant 52 y.o. male with a PMH as mentioned above.   Last seen by Dr. Wyline Mood in 2021. Pt reported an isolated episode of chest pain at the time, was consistent with pleuritic CP in setting of possible viral URI as well as transient palpitations. Dr. Wyline Mood recommended if recurrence, would plan on outpatient event monitor.   Earlier this year, pt had respiratory infection, received tx. Then felt palpitations for couple hours then went away. The following Monday evening palpitations started back, spontaneously converted. Two days later, palpitations/pounding sensation started again with chest pain of uncertain etiology along left side and went to ED in [place], [place] 10/2022. While in the ED, spontaneously converted to NSR prior to dilt administration, received metoprolol and IV Mag. Previous EKG showed A-fib with RVR.    Contacted our office about A-fib episode. EKG at last OV showed NSR. Denied any chest pain, shortness of breath, or other associated symptoms. Admitted to continued drinking of alcohol, was  weaning himself off.   Today he presents for office follow-up. He states he is doing well. Has not had to use PRN diltiazem. Denies any chest pain, shortness of breath, palpitations, syncope, presyncope, dizziness, orthopnea, PND, swelling or significant weight changes, acute bleeding, or claudication. Now is only drinking 4 oz of alcohol daily, planning to be sober May 2024.   EKGs/Labs/Other Studies Reviewed:   The following studies were reviewed today:   EKG:  EKG is not ordered today.    Echo 11/2022: 1. Left ventricular ejection fraction, by estimation, is 60 to 65%. The  left ventricle has normal function. The left ventricle has no regional  wall motion abnormalities. Left ventricular diastolic parameters were  normal.   2. Right ventricular systolic function is normal. The right ventricular  size is normal.   3. The mitral valve is grossly normal. No evidence of mitral valve  regurgitation. No evidence of mitral stenosis.   4. The aortic valve is tricuspid. Aortic valve regurgitation is not  visualized. No aortic stenosis is present.   5. Aortic dilatation noted. 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   Comparison(s): No prior Echocardiogram.   Recent Labs: 11/03/2022: BUN 10; Creat 0.72; Magnesium 2.3; Potassium 4.2; Sodium 141  Recent Lipid Panel No results found for: "CHOL", "TRIG", "HDL", "CHOLHDL", "VLDL", "LDLCALC", "LDLDIRECT"  Risk Assessment/Calculations:   CHA2DS2-VASc Score = 1   This indicates a 0.6% annual risk of stroke. The patient's score is based upon: CHF  History: 0 HTN History: 1 Diabetes History: 0 Stroke History: 0 Vascular Disease History: 0 Age Score: 0 Gender Score: 0   Home Medications   Current Meds  Medication Sig   cyclobenzaprine (FLEXERIL) 10 MG tablet Take 5-10 mg by mouth 3 (three) times daily as needed.   diltiazem (CARDIZEM) 30 MG tablet Take 1 tablet (30 mg total) by mouth 2 (two) times daily as  needed (palpitations).   levothyroxine (SYNTHROID, LEVOTHROID) 50 MCG tablet Take 1 tablet (50 mcg total) by mouth daily before breakfast.   liothyronine (CYTOMEL) 25 MCG tablet Take 1 tablet (25 mcg total) by mouth daily.   meloxicam (MOBIC) 15 MG tablet Take 15 mg by mouth daily as needed (for arthritis pain).   propranolol ER (INDERAL LA) 160 MG SR capsule Take 1 capsule (160 mg total) by mouth daily.    Review of Systems    All other systems reviewed and are otherwise negative except as noted above.  Physical Exam    VS:  BP 104/80 (BP Location: Right Arm, Patient Position: Sitting, Cuff Size: Large)   Pulse 62   Ht  (1.803 m)   Wt 253 lb 12.8 oz (115.1 kg)   SpO2 97%   BMI 35.40 kg/m  , BMI Body mass index is 35.4 kg/m.  Wt Readings from Last 3 Encounters:  11/16/22 253 lb 12.8 oz (115.1 kg)  10/16/22 260 lb 3.2 oz (118 kg)  02/16/20 274 lb (124.3 kg)     GEN: Obese, 52 y.o. male in no acute distress. HEENT: normal. Neck: Supple, no JVD, carotid bruits, or masses. Cardiac: S1/S2, RRR, no murmurs, rubs, or gallops. No clubbing, cyanosis, edema.  Radials/PT 2+ and equal bilaterally.  Respiratory:  Respirations regular and unlabored, clear to auscultation bilaterally. MS: No deformity or atrophy. Skin: Warm and dry, no rash. Neuro:  Strength and sensation are intact. Psych: Normal affect.  Assessment & Plan    PAF, palpitations Denies any recent palpitations or tachycardia. CHA2DS2-VASc score of 1, therefore does not require anticoagulation at this time. He is in NSR on exam today. On propranolol for tremors r/t to ETOH abuse - see #3. ED precautions discussed.   Hx of pericarditis Denies any chest pain. Did have hx of pericarditis in setting of illness in past. No medication changes. Heart healthy diet and regular cardiovascular exercise encouraged.  ED precautions discussed.   Alcoholism Weaning his alcohol intake. Recent labs stable. Continue to follow with  PCP and advised multivitamin and that PCP consider starting other vitamins for ETOH abuse.   Obesity BMI 35.40. Weight loss via diet and exercise encouraged. Discussed the impact being overweight would have on cardiovascular risk.  Disposition: Follow up in 6 months with Dina Rich, MD or APP.  Signed, Sharlene Dory, NP 11/16/2022, 8:43 PM [facility]

## 2023-08-24 ENCOUNTER — Other Ambulatory Visit: Payer: Self-pay | Admitting: *Deleted

## 2023-08-24 DIAGNOSIS — I77810 Thoracic aortic ectasia: Secondary | ICD-10-CM

## 2023-09-20 ENCOUNTER — Telehealth: Payer: Self-pay | Admitting: Cardiology

## 2023-09-20 DIAGNOSIS — R002 Palpitations: Secondary | ICD-10-CM

## 2023-09-20 NOTE — Telephone Encounter (Signed)
Patient c/o Palpitations:  STAT if patient reporting lightheadedness, shortness of breath, or chest pain  How long have you had palpitations/irregular HR/ Afib? Are you having the symptoms now? Since Friday, not right at this moment  Are you currently experiencing lightheadedness, SOB or CP? Chest tightness  Do you have a history of afib (atrial fibrillation) or irregular heart rhythm? yes  Have you checked your BP or HR? (document readings if available): No to BP- HR low 60's  Are you experiencing any other symptoms? headache

## 2023-09-21 ENCOUNTER — Ambulatory Visit: Payer: 59 | Attending: Nurse Practitioner

## 2023-09-21 DIAGNOSIS — R002 Palpitations: Secondary | ICD-10-CM

## 2023-09-21 NOTE — Telephone Encounter (Signed)
Patient notified and verbalized understanding. Nurse appt made for 2/21 in the  [place] office as pt lives in [place]. Pt agreeable with monitor. Patient had no questions or concerns at this time.

## 2023-09-21 NOTE — Telephone Encounter (Signed)
Spoke to pt who stated that he has had palpitations starting Thurs./Fri. Pt stated that it does not feel like before. Pt stated that his PR at this time is 60-62 bpm. Pt stated that it feels like his heart is skipping a beat every 30 seconds, then goes back to a normal rhythm. Pt stated this has not happened this morning, but feels worse at night. Pt attributes his chest tightness to the anxiety he feels when he has palpitations. Pt also stated that his headache is gone for now and denies any sob at the moment. Pt stated that he was prescribed diltiazem at his last ov with provider, but stated that he knows this medication can lower hr/bp and is hesitant to take medication given his hr is already in the low 60's.   Please advise.

## 2023-09-24 ENCOUNTER — Ambulatory Visit: Payer: 59 | Attending: Cardiology | Admitting: *Deleted

## 2023-09-24 DIAGNOSIS — R002 Palpitations: Secondary | ICD-10-CM | POA: Diagnosis not present

## 2023-09-24 NOTE — Progress Notes (Signed)
Pt c/o palpitations states that they are worse at night and after eating. Reports some "mild"  chest pain on Wednesday. Pt reports feeling better since Wednesday. States that on Sunday - Tuesday the palpitations were at the worse during the night.

## 2023-10-04 ENCOUNTER — Ambulatory Visit: Payer: 59 | Admitting: Nurse Practitioner

## 2023-10-18 ENCOUNTER — Telehealth: Payer: Self-pay | Admitting: Nurse Practitioner

## 2023-10-18 DIAGNOSIS — I480 Paroxysmal atrial fibrillation: Secondary | ICD-10-CM

## 2023-10-18 DIAGNOSIS — Z13220 Encounter for screening for lipoid disorders: Secondary | ICD-10-CM

## 2023-10-18 DIAGNOSIS — Z79899 Other long term (current) drug therapy: Secondary | ICD-10-CM

## 2023-10-18 DIAGNOSIS — Z8249 Family history of ischemic heart disease and other diseases of the circulatory system: Secondary | ICD-10-CM

## 2023-10-18 DIAGNOSIS — E875 Hyperkalemia: Secondary | ICD-10-CM

## 2023-10-18 NOTE — Telephone Encounter (Signed)
 Pt called in stating he would like to have labs drawn prior to appt 11/05/23. Previous order expired 10/16/23, can new orders be placed?  He also asked for an update on heart monitor results

## 2023-10-18 NOTE — Telephone Encounter (Signed)
 Resent lab orders for patient to do. Would you like any additional labs to be drawn? Also patient is looking for Monitor results.

## 2023-10-18 NOTE — Telephone Encounter (Signed)
 Patient informed and verbalized understanding of plan. Patient said to ask Dr.Branch Pretty please to read these he does not want to wait until his appointment. To know the results.

## 2023-11-05 ENCOUNTER — Ambulatory Visit: Payer: 59 | Attending: Nurse Practitioner | Admitting: Nurse Practitioner

## 2023-11-05 ENCOUNTER — Encounter: Payer: Self-pay | Admitting: Nurse Practitioner

## 2023-11-05 VITALS — BP 130/88 | HR 58 | Ht 71.0 in | Wt 242.2 lb

## 2023-11-05 DIAGNOSIS — I77810 Thoracic aortic ectasia: Secondary | ICD-10-CM

## 2023-11-05 DIAGNOSIS — Z8679 Personal history of other diseases of the circulatory system: Secondary | ICD-10-CM

## 2023-11-05 DIAGNOSIS — E669 Obesity, unspecified: Secondary | ICD-10-CM

## 2023-11-05 DIAGNOSIS — I480 Paroxysmal atrial fibrillation: Secondary | ICD-10-CM

## 2023-11-05 DIAGNOSIS — F1011 Alcohol abuse, in remission: Secondary | ICD-10-CM

## 2023-11-05 DIAGNOSIS — Z13220 Encounter for screening for lipoid disorders: Secondary | ICD-10-CM

## 2023-11-05 DIAGNOSIS — R002 Palpitations: Secondary | ICD-10-CM | POA: Diagnosis not present

## 2023-11-05 DIAGNOSIS — E7800 Pure hypercholesterolemia, unspecified: Secondary | ICD-10-CM

## 2023-11-05 NOTE — Progress Notes (Addendum)
 Office Visit    Patient Name: Brett Bailey Date of Encounter: 11/05/2023 PCP:  [facility]  Cardiologist:  Brett Rich, MD  Advanced Practice Provider:  Sharlene Dory, NP Electrophysiologist:  None   Chief Complaint and HPI    Brett Bailey is a 53 y.o. male with a hx of chest pain, palpitations, history of pericarditis, hx of thyroid disease, alcoholism, PAF, and obesity, who presents today for overdue follow-up.  Last seen by Dr. Wyline Bailey in 2021. Pt reported an isolated episode of chest pain at the time, was consistent with pleuritic CP in setting of possible viral URI as well as transient palpitations. Dr. Wyline Bailey recommended if recurrence, would plan on outpatient event monitor.   Earlier in 2024, pt had respiratory infection, received tx. Then felt palpitations for couple hours then went away. The following Monday evening palpitations started back, spontaneously converted. Two days later, palpitations/pounding sensation started again with chest pain of uncertain etiology along left side and went to ED in [place], [place] 10/2022. While in the ED, spontaneously converted to NSR prior to dilt administration, received metoprolol and IV Mag. Previous EKG showed A-fib with RVR.    Contacted our office about A-fib episode. EKG at last OV showed NSR. Denied any chest pain, shortness of breath, or other associated symptoms. Admitted to continued drinking of alcohol, was weaning himself off.   11/16/2022 - Today he presents for office follow-up. He states he is doing well. Has not had to use PRN diltiazem. Denies any chest pain, shortness of breath, palpitations, syncope, presyncope, dizziness, orthopnea, PND, swelling or significant weight changes, acute bleeding, or claudication. Now is only drinking 4 oz of alcohol daily, planning to be sober May 2024.   11/05/2023 -presents today for follow-up.  He is doing better since I last saw  him.  Tells me he is focusing on lifestyle changes to lose weight.  Not drinking as much alcohol, slowly weaning himself off.  Chief concern is pounding sensation in his chest, describes what appears like PVCs.  Denies any tachycardia. Denies any chest pain, shortness of breath, syncope, presyncope, dizziness, orthopnea, PND, swelling or significant weight changes, acute bleeding, or claudication.  Does admit to taking a magnesium supplement over-the-counter occasionally.  Does admit to taking diltiazem as needed, says he has not been able to tell a difference in his symptoms.  Does have some episodes of feeling lightheaded, denies any dizziness.   EKGs/Labs/Other Studies Reviewed:   The following studies were reviewed today:   EKG:  EKG is not ordered today.    Preliminary cardiac monitor report 10/2023:  Patch Wear Time:  8 days and 15 hours (2025-02-22T14:50:00-0500 to 2025-03-03T06:29:15-0500)   Patient had a min HR of 42 bpm, max HR of 114 bpm, and avg HR of 63 bpm. Predominant underlying rhythm was Sinus Rhythm. Isolated SVEs were rare (<1.0%), SVE Triplets were rare (<1.0%), and no SVE Couplets were present. Isolated VEs were rare (<1.0%),  and no VE Couplets or VE Triplets were present.   Echo 11/2022: 1. Left ventricular ejection fraction, by estimation, is 60 to 65%. The  left ventricle has normal function. The left ventricle has no regional  wall motion abnormalities. Left ventricular diastolic parameters were  normal.   2. Right ventricular systolic function is normal. The right ventricular  size is normal.   3. The mitral valve is grossly normal. No evidence of mitral valve  regurgitation. No evidence of mitral stenosis.  4. The aortic valve is tricuspid. Aortic valve regurgitation is not  visualized. No aortic stenosis is present.   5. Aortic dilatation noted. 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   Comparison(s): No prior  Echocardiogram.   Review of Systems    All other systems reviewed and are otherwise negative except as noted above.  Physical Exam    VS:  BP 130/88 (BP Location: Left Arm, Cuff Size: Normal)   Pulse (!) 58   Ht 5\' 11"  (1.803 m)   Wt 242 lb 3.2 oz (109.9 kg)   SpO2 97%   BMI 33.78 kg/m  , BMI Body mass index is 33.78 kg/m.  Wt Readings from Last 3 Encounters:  11/05/23 242 lb 3.2 oz (109.9 kg)  11/16/22 253 lb 12.8 oz (115.1 kg)  10/16/22 260 lb 3.2 oz (118 kg)     GEN: Obese, 53 y.o. male in no acute distress. HEENT: normal. Neck: Supple, no JVD, carotid bruits, or masses. Cardiac: S1/S2, RRR, no murmurs, rubs, or gallops. No clubbing, cyanosis, edema.  Radials/PT 2+ and equal bilaterally.  Respiratory:  Respirations regular and unlabored, clear to auscultation bilaterally. MS: No deformity or atrophy. Skin: Warm and dry, no rash. Neuro:  Strength and sensation are intact. Psych: Normal affect.  Assessment & Plan    PAF, palpitations Denies any recent tachycardia.  Does admit to some palpitations and what sounds like possible PACs/PVCs at times.  Recent preliminary cardiac monitor report was benign.  CHA2DS2-VASc score of 1, therefore does not require anticoagulation at this time. On propranolol for tremors r/t to ETOH abuse - see #3. ED precautions discussed.  Will obtain CBC, CMET, and Mag for further evaluation. Will  d/c diltiazem PRN.   Hx of pericarditis Denies any chest pain. Did have hx of pericarditis in setting of illness in past. No medication changes. Heart healthy diet and regular cardiovascular exercise encouraged.  ED precautions discussed.   3.  Aortic dilatation Echocardiogram in April 2024 showed borderline dilatation of aortic root, measuring 37 mm.  Will update echocardiogram later this month that is already scheduled.  Continue follow-up with PCP.  4. Alcoholism Weaning his alcohol intake.  Will check labs including CBC, CMET, magnesium.  Continue  to follow with PCP.  5. Obesity Weight loss via diet and exercise encouraged. Discussed the impact being overweight would have on cardiovascular risk.  Congratulated him on his weight loss since I last saw him.  6.  Screening for hyperlipidemia He is due for labs to be checked.  Will obtain FLP in addition to lab work as mentioned above. Heart healthy diet and regular cardiovascular exercise encouraged.    Disposition: Follow up in 3 months with Brett Rich, MD or APP.  Signed, Brett Dory, NP

## 2023-11-05 NOTE — Patient Instructions (Addendum)
 Medication Instructions:  Your physician has recommended you make the following change in your medication:  Please stop Diltiazem   Labwork: In 1-2 weeks at [facility]   Testing/Procedures: None   Follow-Up: Your physician recommends that you schedule a follow-up appointment in: 3 months   Any Other Special Instructions Will Be Listed Below (If Applicable).  If you need a refill on your cardiac medications before your next appointment, please call your pharmacy.

## 2023-11-10 ENCOUNTER — Other Ambulatory Visit: Payer: 59

## 2023-11-20 LAB — CBC
Hematocrit: 46.1 % (ref 37.5–51.0)
Hemoglobin: 16.4 g/dL (ref 13.0–17.7)
MCH: 32.5 pg (ref 26.6–33.0)
MCHC: 35.6 g/dL (ref 31.5–35.7)
MCV: 92 fL (ref 79–97)
Platelets: 194 10*3/uL (ref 150–450)
RBC: 5.04 x10E6/uL (ref 4.14–5.80)
RDW: 13 % (ref 11.6–15.4)
WBC: 8.4 10*3/uL (ref 3.4–10.8)

## 2023-11-20 LAB — COMPREHENSIVE METABOLIC PANEL WITH GFR
ALT: 22 IU/L (ref 0–44)
AST: 21 IU/L (ref 0–40)
Albumin: 4.4 g/dL (ref 4.1–5.1)
Alkaline Phosphatase: 79 IU/L (ref 44–121)
BUN/Creatinine Ratio: 14 (ref 9–20)
BUN: 11 mg/dL (ref 6–24)
Bilirubin Total: 2.1 mg/dL — ABNORMAL HIGH (ref 0.0–1.2)
CO2: 20 mmol/L (ref 20–29)
Calcium: 9.5 mg/dL (ref 8.7–10.2)
Chloride: 106 mmol/L (ref 96–106)
Creatinine, Ser: 0.79 mg/dL (ref 0.76–1.27)
Globulin, Total: 2.5 g/dL (ref 1.5–4.5)
Glucose: 90 mg/dL (ref 70–99)
Potassium: 4.1 mmol/L (ref 3.5–5.2)
Sodium: 142 mmol/L (ref 134–144)
Total Protein: 6.9 g/dL (ref 6.0–8.5)
eGFR: 108 mL/min/{1.73_m2} (ref >59)

## 2023-11-20 LAB — LIPID PANEL
Chol/HDL Ratio: 3.5 ratio (ref 0.0–5.0)
Cholesterol, Total: 136 mg/dL (ref 100–199)
HDL: 39 mg/dL — ABNORMAL LOW (ref >39)
LDL Chol Calc (NIH): 83 mg/dL (ref 0–99)
Triglycerides: 66 mg/dL (ref 0–149)
VLDL Cholesterol Cal: 14 mg/dL (ref 5–40)

## 2023-11-20 LAB — MAGNESIUM: Magnesium: 1.9 mg/dL (ref 1.6–2.3)

## 2023-11-22 ENCOUNTER — Ambulatory Visit: Payer: 59 | Attending: Nurse Practitioner

## 2023-11-22 DIAGNOSIS — I77810 Thoracic aortic ectasia: Secondary | ICD-10-CM

## 2023-11-22 LAB — ECHOCARDIOGRAM COMPLETE
AR max vel: 2.89 cm2
AV Area VTI: 2.72 cm2
AV Area mean vel: 2.98 cm2
AV Mean grad: 4 mmHg
AV Peak grad: 9.2 mmHg
Ao pk vel: 1.52 m/s
Area-P 1/2: 3.12 cm2
Calc EF: 54.4 %
MV VTI: 2.54 cm2
S' Lateral: 3.6 cm
Single Plane A2C EF: 53.1 %
Single Plane A4C EF: 59.2 %

## 2023-11-22 MED ORDER — PERFLUTREN LIPID MICROSPHERE
1.0000 mL | INTRAVENOUS | Status: AC | PRN
Start: 2023-11-22 — End: 2023-11-22
  Administered 2023-11-22: 2 mL via INTRAVENOUS

## 2023-12-02 LAB — HM COLONOSCOPY

## 2023-12-07 ENCOUNTER — Telehealth: Payer: Self-pay | Admitting: Gastroenterology

## 2023-12-07 NOTE — Telephone Encounter (Signed)
 Dr. Brice Campi,  Please see referral from Dr. Honey Lusty.   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 Proceed with EMR.  Referral placed on your desk for review.  Thank you, Trevor Fudge

## 2023-12-08 NOTE — Telephone Encounter (Signed)
 This case has been reviewed with Dr. Honey Lusty and I. Please offer patient clinic visit (okay to [facility] mews health slot or 3:50 PM slot). Please offer colonoscopy with EMR 75-minute slot next available. Please update Dr. Honey Lusty when patient has been scheduled. Thanks. GM

## 2023-12-14 ENCOUNTER — Other Ambulatory Visit: Payer: Self-pay

## 2023-12-14 DIAGNOSIS — Z860100 Personal history of colon polyps, unspecified: Secondary | ICD-10-CM

## 2023-12-14 MED ORDER — NA SULFATE-K SULFATE-MG SULF 17.5-3.13-1.6 GM/177ML PO SOLN
1.0000 | Freq: Once | ORAL | 0 refills | Status: AC
Start: 2023-12-14 — End: 2023-12-14

## 2023-12-14 NOTE — Telephone Encounter (Signed)
 Left message on machine to call back

## 2023-12-14 NOTE — Telephone Encounter (Signed)
 Colon EMR has been set up for 02/24/24 at 1045 am at [facility] with GM  ROV has been set up for 01/21/24 at 230 pm with GM

## 2023-12-15 NOTE — Telephone Encounter (Signed)
 Colon EMR ROV  scheduled, pt instructed and medications reviewed.  Patient instructions mailed to home.  Patient to call with any questions or concerns.

## 2023-12-15 NOTE — Telephone Encounter (Signed)
 Patient confirmed appts, states patty could call him at her earliest convenience.

## 2023-12-15 NOTE — Telephone Encounter (Signed)
 Left message on machine to call back

## 2023-12-16 NOTE — Telephone Encounter (Signed)
 Colon EMR ROV  scheduled, pt instructed and medications reviewed.  Patient instructions mailed to home.  Patient to call with any questions or concerns.

## 2023-12-19 DIAGNOSIS — R002 Palpitations: Secondary | ICD-10-CM | POA: Diagnosis not present

## 2023-12-22 ENCOUNTER — Ambulatory Visit: Payer: Self-pay | Admitting: Nurse Practitioner

## 2024-01-05 NOTE — Telephone Encounter (Signed)
 Dr Brice Campi the pt states [place] will be out of the [place] on the office visit appt he has scheduled with you to discuss upcoming procedure.  There are no other appts available before the procedure date.  He did say that he would be able to have a telehealth visit on the office visit day if that would work. Please advise

## 2024-01-05 NOTE — Telephone Encounter (Signed)
 Inbound call from patient, states he will be on vacation from 6/16 to the 6/21 and needs to reschedule his consult with Dr. Brice Campi, prior to his procedure in July.

## 2024-01-07 NOTE — Telephone Encounter (Signed)
 Why do not you try to offer this patient 7/16 4:10 PM telehealth and I will call him at the end of clinic session. Then he does not have to worry about doing the clinic visit during his vacation. Let me know what he decides. Thanks. GM

## 2024-01-07 NOTE — Telephone Encounter (Signed)
 Patient contacted. He agrees to this plan of care. My  Chart visit arranged.

## 2024-01-21 ENCOUNTER — Ambulatory Visit: Admitting: Gastroenterology

## 2024-02-11 ENCOUNTER — Ambulatory Visit: Attending: Nurse Practitioner | Admitting: Nurse Practitioner

## 2024-02-11 ENCOUNTER — Encounter: Payer: Self-pay | Admitting: Nurse Practitioner

## 2024-02-11 VITALS — BP 124/72 | HR 63 | Ht 71.0 in | Wt 240.2 lb

## 2024-02-11 DIAGNOSIS — R002 Palpitations: Secondary | ICD-10-CM

## 2024-02-11 DIAGNOSIS — E669 Obesity, unspecified: Secondary | ICD-10-CM

## 2024-02-11 DIAGNOSIS — Z01810 Encounter for preprocedural cardiovascular examination: Secondary | ICD-10-CM

## 2024-02-11 DIAGNOSIS — I77810 Thoracic aortic ectasia: Secondary | ICD-10-CM

## 2024-02-11 DIAGNOSIS — I480 Paroxysmal atrial fibrillation: Secondary | ICD-10-CM

## 2024-02-11 DIAGNOSIS — Z8679 Personal history of other diseases of the circulatory system: Secondary | ICD-10-CM | POA: Diagnosis not present

## 2024-02-11 DIAGNOSIS — F1011 Alcohol abuse, in remission: Secondary | ICD-10-CM

## 2024-02-11 NOTE — Patient Instructions (Addendum)

## 2024-02-11 NOTE — Progress Notes (Unsigned)
 Office Visit    Patient Name: Brett Bailey Date of Encounter: 02/11/2024 PCP:  [facility]  Cardiologist:  Alvan Carrier, MD  Advanced Practice Provider:  Miriam Norris, NP Electrophysiologist:  None   Chief Complaint and HPI    Brett Bailey is a 53 y.o. male with a hx of chest pain, palpitations, history of pericarditis, hx of thyroid disease, alcoholism, PAF, and obesity, who presents today for scheduled follow-up.  Last seen by Dr. Alvan in 2021. Pt reported an isolated episode of chest pain at the time, was consistent with pleuritic CP in setting of possible viral URI as well as transient palpitations. Dr. Alvan recommended if recurrence, would plan on outpatient event monitor.   Earlier in 2024, pt had respiratory infection, received tx. Then felt palpitations for couple hours then went away. The following Monday evening palpitations started back, spontaneously converted. Two days later, palpitations/pounding sensation started again with chest pain of uncertain etiology along left side and went to ED in [place], [place] 10/2022. While in the ED, spontaneously converted to NSR prior to dilt administration, received metoprolol and IV Mag. Previous EKG showed A-fib with RVR.    Contacted our office about A-fib episode. EKG at last OV showed NSR. Denied any chest pain, shortness of breath, or other associated symptoms. Admitted to continued drinking of alcohol, was weaning himself off.   11/16/2022 - Today he presents for office follow-up. He states he is doing well. Has not had to use PRN diltiazem . Denies any chest pain, shortness of breath, palpitations, syncope, presyncope, dizziness, orthopnea, PND, swelling or significant weight changes, acute bleeding, or claudication. Now is only drinking 4 oz of alcohol daily, planning to be sober May 2024.   11/05/2023 -presents today for follow-up.  He is doing better since I last saw  him.  Tells me he is focusing on lifestyle changes to lose weight.  Not drinking as much alcohol, slowly weaning himself off.  Chief concern is pounding sensation in his chest, describes what appears like PVCs.  Denies any tachycardia. Denies any chest pain, shortness of breath, syncope, presyncope, dizziness, orthopnea, PND, swelling or significant weight changes, acute bleeding, or claudication.  Does admit to taking a magnesium supplement over-the-counter occasionally.  Does admit to taking diltiazem  as needed, says he has not been able to tell a difference in his symptoms.  Does have some episodes of feeling lightheaded, denies any dizziness.   02/11/2024 - Here for follow-up. He is here for pre-op clearance. He is scheduled for upcoming colonoscopy later this month. Overall doing well. Denies any chest pain, shortness of breath, syncope, presyncope, dizziness, orthopnea, PND, swelling or significant weight changes, acute bleeding, or claudication. Says he will be seeing an ENT soon, has occasional sensation of smelling cigarette smoke. Does admit to sensation of early beats, not bothersome per his report.   EKGs/Labs/Other Studies Reviewed:   The following studies were reviewed today:   EKG:  EKG is not ordered today.    Echo 11/2023:  1. Left ventricular ejection fraction, by estimation, is 60 to 65%. Left  ventricular ejection fraction by 3D volume is 71 %. The left ventricle has  normal function. The left ventricle has no regional wall motion  abnormalities. Left ventricular diastolic parameters were normal.   2. Right ventricular systolic function is normal. The right ventricular  size is normal.   3. The mitral valve is normal in structure. No evidence of mitral valve  regurgitation. No evidence of mitral stenosis.   4. The aortic valve is tricuspid. Aortic valve regurgitation is not  visualized. No aortic stenosis is present.   5. Aortic dilatation noted. There is borderline dilatation  of the aortic  root, measuring 38 mm.   6. Increased flow velocities may be secondary to anemia, thyrotoxicosis, hyperdynamic or high flow state.   Comparison(s): A prior study was performed on 11/10/2022. No significant  change from prior study. EF 60-65%. Borderline aortic root dilatation: 37 mm.   Cardiac monitor report 10/2023:    8 day monitor   Rare supraventricular ectopy in the form of isolated PACs, triplets   Rare ventricular ectopy in the form of isolated PVCs   Symptoms correlated with normal sinus rhythm, rare PVCs     Patch Wear Time:  8 days and 15 hours (2025-02-22T14:50:00-0500 to 2025-03-03T06:29:15-0500)   Patient had a min HR of 42 bpm, max HR of 114 bpm, and avg HR of 63 bpm. Predominant underlying rhythm was Sinus Rhythm. Isolated SVEs were rare (<1.0%), SVE Triplets were rare (<1.0%), and no SVE Couplets were present. Isolated VEs were rare (<1.0%),  and no VE Couplets or VE Triplets were present.     Echo 11/2022: 1. Left ventricular ejection fraction, by estimation, is 60 to 65%. The  left ventricle has normal function. The left ventricle has no regional  wall motion abnormalities. Left ventricular diastolic parameters were  normal.   2. Right ventricular systolic function is normal. The right ventricular  size is normal.   3. The mitral valve is grossly normal. No evidence of mitral valve  regurgitation. No evidence of mitral stenosis.   4. The aortic valve is tricuspid. Aortic valve regurgitation is not  visualized. No aortic stenosis is present.   5. Aortic dilatation noted. 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   Comparison(s): No prior Echocardiogram.   Review of Systems    All other systems reviewed and are otherwise negative except as noted above.  Physical Exam    VS:  BP 124/72 (BP Location: Left Arm)   Pulse 63   Ht 5' 11 (1.803 m)   Wt 240 lb 3.2 oz (109 kg)   SpO2 96%   BMI 33.50 kg/m  , BMI Body mass  index is 33.5 kg/m.  Wt Readings from Last 3 Encounters:  02/11/24 240 lb 3.2 oz (109 kg)  11/05/23 242 lb 3.2 oz (109.9 kg)  11/16/22 253 lb 12.8 oz (115.1 kg)     GEN: Obese, 53 y.o. male in no acute distress. HEENT: normal. Neck: Supple, no JVD, carotid bruits, or masses. Cardiac: S1/S2, RRR, no murmurs, rubs, or gallops. No clubbing, cyanosis, edema.  Radials/PT 2+ and equal bilaterally.  Respiratory:  Respirations regular and unlabored, clear to auscultation bilaterally. MS: No deformity or atrophy. Skin: Warm and dry, no rash. Neuro:  Strength and sensation are intact. Psych: Normal affect.  Assessment & Plan    PAF, palpitations Denies any recent tachycardia.  Does admit to some palpitations and what sounds like possible PACs/PVCs at times - not bothersome per his report.  Recent cardiac monitor report was benign.  CHA2DS2-VASc score of 1, therefore does not require anticoagulation at this time. On propranolol  for tremors r/t to ETOH abuse - see #3. ED precautions discussed.   2. Hx of pericarditis Denies any chest pain. Did have hx of pericarditis in setting of illness in past. No medication changes. Heart healthy diet and  regular cardiovascular exercise encouraged.  ED precautions discussed.   3.  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 Will continue to monitor. Recommend re-evaluating this in April 2026. Continue follow-up with PCP.  4. Alcoholism Weaning his alcohol intake.  Continue to follow with PCP.  5. Obesity Weight loss via diet and exercise encouraged. Discussed the impact being overweight would have on cardiovascular risk.    6. Pre-operative cardiovascular risk assessment Mr. Mohrmann perioperative risk of a major cardiac event is 0.4% according to the Revised Cardiac Risk Index (RCRI).  Therefore, he is at low risk for perioperative complications.   His functional capacity is good at 6.7 METs according to the  [employer] Activity Status Index (DASI). Recommendations: According to ACC/AHA guidelines, no further cardiovascular testing needed.  The patient may proceed to surgery at acceptable risk.   Antiplatelet and/or Anticoagulation Recommendations: He is not on any ASA or blood thinner that needs to be held prior to procedure.     Disposition: Follow up in 6 months with Alvan Carrier, MD or APP.  Signed, Almarie Crate, NP

## 2024-02-16 ENCOUNTER — Telehealth: Admitting: Gastroenterology

## 2024-02-16 ENCOUNTER — Encounter (HOSPITAL_COMMUNITY): Payer: Self-pay | Admitting: Gastroenterology

## 2024-02-17 ENCOUNTER — Telehealth: Payer: Self-pay | Admitting: Gastroenterology

## 2024-02-17 NOTE — Telephone Encounter (Signed)
 Procedure:Colonoscopy Procedure date: 02/24/24 Procedure location: [facility] Arrival Time: 9:15 am Spoke with the patient Y/N:   No, I left a detailed message on [phone] on 02/17/24 @ 4:19 am for the patient to return call Yes, 02/18/24 @ 11:04 am   Any prep concerns? No  Has the patient obtained the prep from the pharmacy ? Yes Do you have a care partner and transportation:Yes Any additional concerns? No

## 2024-02-18 ENCOUNTER — Telehealth (INDEPENDENT_AMBULATORY_CARE_PROVIDER_SITE_OTHER): Admitting: Gastroenterology

## 2024-02-18 DIAGNOSIS — R933 Abnormal findings on diagnostic imaging of other parts of digestive tract: Secondary | ICD-10-CM

## 2024-02-18 DIAGNOSIS — Z860101 Personal history of adenomatous and serrated colon polyps: Secondary | ICD-10-CM | POA: Diagnosis not present

## 2024-02-18 DIAGNOSIS — D122 Benign neoplasm of ascending colon: Secondary | ICD-10-CM | POA: Diagnosis not present

## 2024-02-18 NOTE — Progress Notes (Signed)
 GASTROENTEROLOGY OUTPATIENT CLINIC VISIT   Primary Care Provider Pa, [facility] [address] [place] [address] [place] [address] [phone]  Referring Provider Dr. Dianna  Patient Profile: Brett Bailey is a 53 y.o. male with a pmh significant for Hypothyroidism (status post thyroidectomy), hyperlipidemia, hypertension, colon polyps (TA's/SSP's/TVA in ascending colon in situ).  The patient presents to the [facility] for an evaluation and management of problem(s) noted below:  Problem List 1. Adenomatous polyp of ascending colon   2. Hx of adenomatous colonic polyps   3. Abnormal colonoscopy    I connected with  Brett Bailey. I verified that I was speaking with the correct person using two identifiers. This service was provided via telemedicine. The patient was located at home. The provider was located in the office. The patient did consent to this visit and is aware of charges through their insurance as well as the limitations of evaluation and management by telemedicine. The patient was referred by Dr. Dianna Other persons participating in this telemedicine service were none.  History of Present Illness This is the patient's first visit to [facility].  The patient recently underwent his first screening colonoscopy, with Dr. Dianna of [facility].  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  It is for this reason that the patient is referred to consider advanced endoscopic resection of this tubulovillous adenoma (an adjacent tubular adenoma) prior to surgery if possible.  Patient has no GI symptoms at this time.  No family history of colon cancer.  No changes in his bowel habits or blood in the stool has been noted.  He is hopeful to not need surgery.  GI Review of Systems Positive as above Negative for dysphagia, odynophagia,  abdominal pain  Review of Systems General: Denies fevers/chills/weight loss unintentionally Cardiovascular: Denies chest pain Pulmonary: Denies shortness of breath Gastroenterological: See HPI Genitourinary: Denies darkened urine Hematological: Denies easy bruising/bleeding Dermatological: Denies jaundice Psychological: Mood is stable  Medications Current Outpatient Medications  Medication Sig Dispense Refill   chlordiazePOXIDE  (LIBRIUM ) 25 MG capsule 50mg  PO TID x 1D, then 25-50mg  PO BID X 1D, then 25-50mg  PO QD X 1D (Patient not taking: Reported on 02/11/2024) 10 capsule 0   cyclobenzaprine (FLEXERIL) 10 MG tablet Take 5-10 mg by mouth 3 (three) times daily as needed.     levothyroxine  (SYNTHROID , LEVOTHROID) 50 MCG tablet Take 1 tablet (50 mcg total) by mouth daily before breakfast. 30 tablet 0   liothyronine  (CYTOMEL ) 25 MCG tablet Take 1 tablet (25 mcg total) by mouth daily. 30 tablet 0   meloxicam (MOBIC) 15 MG tablet Take 15 mg by mouth daily as needed (for arthritis pain).     propranolol  ER (INDERAL  LA) 160 MG SR capsule Take 1 capsule (160 mg total) by mouth daily. 30 capsule 0   simvastatin  (ZOCOR ) 20 MG tablet Take 1 tablet (20 mg total) by mouth daily at 6 PM. 30 tablet 0   No current facility-administered medications for this visit.    Allergies No Known Allergies  Histories Past Medical History:  Diagnosis Date   Alcoholic (HCC)    History of chest pain    History of pericarditis    Obesity (BMI 30-39.9)    PAF (paroxysmal atrial fibrillation) (HCC)    Palpitations    Thyroid disease    Past Surgical History:  Procedure Laterality Date  THYROIDECTOMY     Social History   Socioeconomic History   Marital status: Married    Spouse name: Not on file   Number of children: Not on file   Years of education: Not on file   Highest education level: Not on file  Occupational History   Not on file  Tobacco Use   Smoking status: Never    Passive exposure:  Never   Smokeless tobacco: Never  Vaping Use   Vaping status: Never Used  Substance and Sexual Activity   Alcohol use: Yes    Alcohol/week: 14.0 standard drinks of alcohol    Types: 14 Shots of liquor per week   Drug use: Never   Sexual activity: Not on file  Other Topics Concern   Not on file  Social History Narrative   Not on file   Social Drivers of Health   Financial Resource Strain: Not on file  Food Insecurity: Not on file  Transportation Needs: Not on file  Physical Activity: Not on file  Stress: Not on file  Social Connections: Not on file  Intimate Partner Violence: Not on file   Family History  Problem Relation Age of Onset   Heart failure Father    Colon cancer Neg Hx    Esophageal cancer Neg Hx    Inflammatory bowel disease Neg Hx    Liver disease Neg Hx    Pancreatic cancer Neg Hx    Rectal cancer Neg Hx    Stomach cancer Neg Hx    I have reviewed his medical, social, and family history in detail and updated the electronic medical record as necessary.    PHYSICAL EXAMINATION  There were no vitals taken for this visit. Wt Readings from Last 3 Encounters:  02/11/24 240 lb 3.2 oz (109 kg)  11/05/23 242 lb 3.2 oz (109.9 kg)  11/16/22 253 lb 12.8 oz (115.1 kg)  Caregility/Telemedicine visit   REVIEW OF DATA  I reviewed the following data at the time of this encounter:  GI Procedures and Studies  Pathology Large intestine ascending colon polyp - Sessile serrated adenoma Large intestine ascending colon biopsy-tubulovillous adenoma Large intestine hepatic flexure polyp-tubular adenoma Large intestine descending colon polyp-tubular adenoma  May 2025 colonoscopy 114 mm polyp in the proximal ascending colon, removed piecemeal hot snare. 112 mm polyp at the hepatic flexure, removed with hot snare.  Resected and retrieved. One 7 mm polyp in the ascending colon.  Not resected. Rule out malignancy, polypoid lesion in the ascending colon.  Biopsied.   Tattooed.  (Large polypoid lesion found in ascending colon.  No bleeding was present.  Area was tattooed.). 1, 15 mm polyp in the descending colon, removed with hot snare. Internal hemorrhoids. The examined portion of the ileum was normal.  Laboratory Studies  Reviewed those in epic  Imaging Studies  No relevant studies to review   ASSESSMENT  Mr. Sipp is a 53 y.o. male with a pmh significant for obesity, atrial fibrillation paroxysmal, colon polyps (ascending colon in situ).  The patient is seen today for evaluation and management of:  1. Adenomatous polyp of ascending colon   2. Hx of adenomatous colonic polyps   3. Abnormal colonoscopy    The patient is clinically and hemodynamically stable.  Based upon the description and endoscopic pictures I do feel that it is reasonable to pursue an Advanced Polypectomy attempt of the polyp/lesion.  We discussed some of the techniques of advanced polypectomy which include Endoscopic Mucosal Resection, OVESCO Full-Thickness Resection,  Endorotor Morcellation, and Tissue Ablation via Fulguration.  We also reviewed images of typical techniques as noted above.  The risks and benefits of endoscopic evaluation were discussed with the patient; these include but are not limited to the risk of perforation, infection, bleeding, missed lesions, lack of diagnosis, severe illness requiring hospitalization, as well as anesthesia and sedation related illnesses.  During attempts at advanced resection, the risks of bleeding and perforation/leak are increased as opposed to diagnostic and screening procedures, and that was discussed with the patient as well.   In addition, I explained that with the possible need for piecemeal resection, subsequent short-interval endoscopic evaluation for follow up and potential retreatment of the lesion/area may be necessary.  I did offer, a referral to surgery in order for patient to have opportunity to discuss surgical  management/intervention prior to finalizing decision for attempt at endoscopic removal, however, the patient deferred on this.  If, after attempt at removal of the polyp/lesion, it is found that the patient has a complication or that an invasive lesion or malignant lesion is found, or that the polyp/lesion continues to recur, the patient is aware and understands that surgery may still be indicated/required.  All patient questions were answered, to the best of my ability, and the patient agrees to the aforementioned plan of action with follow-up as indicated.   PLAN  Proceed with increased risk colonoscopy Follow-up to be dictated based on results of colonoscopy   No orders of the defined types were placed in this encounter.   New Prescriptions   No medications on file   Modified Medications   No medications on file    Planned Follow Up No follow-ups on file.   Total Time in Face-to-Face and in Coordination of Care for patient including independent/personal interpretation/review of prior testing, medical history, examination, medication adjustment, communicating results with the patient directly, and documentation within the EHR is 45 minutes.   Aloha Finner, MD [facility] Office # [phone]

## 2024-02-18 NOTE — H&P (View-Only) (Signed)
 GASTROENTEROLOGY OUTPATIENT CLINIC VISIT   Primary Care Provider Pa, [facility] [address] [place] [address] [place] [address] [phone]  Referring Provider Dr. Dianna  Patient Profile: Stan Cantave is a 53 y.o. male with a pmh significant for Hypothyroidism (status post thyroidectomy), hyperlipidemia, hypertension, colon polyps (TA's/SSP's/TVA in ascending colon in situ).  The patient presents to the [facility] for an evaluation and management of problem(s) noted below:  Problem List 1. Adenomatous polyp of ascending colon   2. Hx of adenomatous colonic polyps   3. Abnormal colonoscopy    I connected with  Travante Arline. I verified that I was speaking with the correct person using two identifiers. This service was provided via telemedicine. The patient was located at home. The provider was located in the office. The patient did consent to this visit and is aware of charges through their insurance as well as the limitations of evaluation and management by telemedicine. The patient was referred by Dr. Dianna Other persons participating in this telemedicine service were none.  History of Present Illness This is the patient's first visit to [facility].  The patient recently underwent his first screening colonoscopy, with Dr. Dianna of [facility].  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  It is for this reason that the patient is referred to consider advanced endoscopic resection of this tubulovillous adenoma (an adjacent tubular adenoma) prior to surgery if possible.  Patient has no GI symptoms at this time.  No family history of colon cancer.  No changes in his bowel habits or blood in the stool has been noted.  He is hopeful to not need surgery.  GI Review of Systems Positive as above Negative for dysphagia, odynophagia,  abdominal pain  Review of Systems General: Denies fevers/chills/weight loss unintentionally Cardiovascular: Denies chest pain Pulmonary: Denies shortness of breath Gastroenterological: See HPI Genitourinary: Denies darkened urine Hematological: Denies easy bruising/bleeding Dermatological: Denies jaundice Psychological: Mood is stable  Medications Current Outpatient Medications  Medication Sig Dispense Refill   chlordiazePOXIDE  (LIBRIUM ) 25 MG capsule 50mg  PO TID x 1D, then 25-50mg  PO BID X 1D, then 25-50mg  PO QD X 1D (Patient not taking: Reported on 02/11/2024) 10 capsule 0   cyclobenzaprine (FLEXERIL) 10 MG tablet Take 5-10 mg by mouth 3 (three) times daily as needed.     levothyroxine  (SYNTHROID , LEVOTHROID) 50 MCG tablet Take 1 tablet (50 mcg total) by mouth daily before breakfast. 30 tablet 0   liothyronine  (CYTOMEL ) 25 MCG tablet Take 1 tablet (25 mcg total) by mouth daily. 30 tablet 0   meloxicam (MOBIC) 15 MG tablet Take 15 mg by mouth daily as needed (for arthritis pain).     propranolol  ER (INDERAL  LA) 160 MG SR capsule Take 1 capsule (160 mg total) by mouth daily. 30 capsule 0   simvastatin  (ZOCOR ) 20 MG tablet Take 1 tablet (20 mg total) by mouth daily at 6 PM. 30 tablet 0   No current facility-administered medications for this visit.    Allergies No Known Allergies  Histories Past Medical History:  Diagnosis Date   Alcoholic (HCC)    History of chest pain    History of pericarditis    Obesity (BMI 30-39.9)    PAF (paroxysmal atrial fibrillation) (HCC)    Palpitations    Thyroid disease    Past Surgical History:  Procedure Laterality Date  THYROIDECTOMY     Social History   Socioeconomic History   Marital status: Married    Spouse name: Not on file   Number of children: Not on file   Years of education: Not on file   Highest education level: Not on file  Occupational History   Not on file  Tobacco Use   Smoking status: Never    Passive exposure:  Never   Smokeless tobacco: Never  Vaping Use   Vaping status: Never Used  Substance and Sexual Activity   Alcohol use: Yes    Alcohol/week: 14.0 standard drinks of alcohol    Types: 14 Shots of liquor per week   Drug use: Never   Sexual activity: Not on file  Other Topics Concern   Not on file  Social History Narrative   Not on file   Social Drivers of Health   Financial Resource Strain: Not on file  Food Insecurity: Not on file  Transportation Needs: Not on file  Physical Activity: Not on file  Stress: Not on file  Social Connections: Not on file  Intimate Partner Violence: Not on file   Family History  Problem Relation Age of Onset   Heart failure Father    Colon cancer Neg Hx    Esophageal cancer Neg Hx    Inflammatory bowel disease Neg Hx    Liver disease Neg Hx    Pancreatic cancer Neg Hx    Rectal cancer Neg Hx    Stomach cancer Neg Hx    I have reviewed his medical, social, and family history in detail and updated the electronic medical record as necessary.    PHYSICAL EXAMINATION  There were no vitals taken for this visit. Wt Readings from Last 3 Encounters:  02/11/24 240 lb 3.2 oz (109 kg)  11/05/23 242 lb 3.2 oz (109.9 kg)  11/16/22 253 lb 12.8 oz (115.1 kg)  Caregility/Telemedicine visit   REVIEW OF DATA  I reviewed the following data at the time of this encounter:  GI Procedures and Studies  Pathology Large intestine ascending colon polyp - Sessile serrated adenoma Large intestine ascending colon biopsy-tubulovillous adenoma Large intestine hepatic flexure polyp-tubular adenoma Large intestine descending colon polyp-tubular adenoma  May 2025 colonoscopy 114 mm polyp in the proximal ascending colon, removed piecemeal hot snare. 112 mm polyp at the hepatic flexure, removed with hot snare.  Resected and retrieved. One 7 mm polyp in the ascending colon.  Not resected. Rule out malignancy, polypoid lesion in the ascending colon.  Biopsied.   Tattooed.  (Large polypoid lesion found in ascending colon.  No bleeding was present.  Area was tattooed.). 1, 15 mm polyp in the descending colon, removed with hot snare. Internal hemorrhoids. The examined portion of the ileum was normal.  Laboratory Studies  Reviewed those in epic  Imaging Studies  No relevant studies to review   ASSESSMENT  Mr. Sipp is a 53 y.o. male with a pmh significant for obesity, atrial fibrillation paroxysmal, colon polyps (ascending colon in situ).  The patient is seen today for evaluation and management of:  1. Adenomatous polyp of ascending colon   2. Hx of adenomatous colonic polyps   3. Abnormal colonoscopy    The patient is clinically and hemodynamically stable.  Based upon the description and endoscopic pictures I do feel that it is reasonable to pursue an Advanced Polypectomy attempt of the polyp/lesion.  We discussed some of the techniques of advanced polypectomy which include Endoscopic Mucosal Resection, OVESCO Full-Thickness Resection,  Endorotor Morcellation, and Tissue Ablation via Fulguration.  We also reviewed images of typical techniques as noted above.  The risks and benefits of endoscopic evaluation were discussed with the patient; these include but are not limited to the risk of perforation, infection, bleeding, missed lesions, lack of diagnosis, severe illness requiring hospitalization, as well as anesthesia and sedation related illnesses.  During attempts at advanced resection, the risks of bleeding and perforation/leak are increased as opposed to diagnostic and screening procedures, and that was discussed with the patient as well.   In addition, I explained that with the possible need for piecemeal resection, subsequent short-interval endoscopic evaluation for follow up and potential retreatment of the lesion/area may be necessary.  I did offer, a referral to surgery in order for patient to have opportunity to discuss surgical  management/intervention prior to finalizing decision for attempt at endoscopic removal, however, the patient deferred on this.  If, after attempt at removal of the polyp/lesion, it is found that the patient has a complication or that an invasive lesion or malignant lesion is found, or that the polyp/lesion continues to recur, the patient is aware and understands that surgery may still be indicated/required.  All patient questions were answered, to the best of my ability, and the patient agrees to the aforementioned plan of action with follow-up as indicated.   PLAN  Proceed with increased risk colonoscopy Follow-up to be dictated based on results of colonoscopy   No orders of the defined types were placed in this encounter.   New Prescriptions   No medications on file   Modified Medications   No medications on file    Planned Follow Up No follow-ups on file.   Total Time in Face-to-Face and in Coordination of Care for patient including independent/personal interpretation/review of prior testing, medical history, examination, medication adjustment, communicating results with the patient directly, and documentation within the EHR is 45 minutes.   Aloha Finner, MD [facility] Office # [phone]

## 2024-02-20 ENCOUNTER — Encounter: Payer: Self-pay | Admitting: Gastroenterology

## 2024-02-20 DIAGNOSIS — R933 Abnormal findings on diagnostic imaging of other parts of digestive tract: Secondary | ICD-10-CM | POA: Insufficient documentation

## 2024-02-20 DIAGNOSIS — D122 Benign neoplasm of ascending colon: Secondary | ICD-10-CM | POA: Insufficient documentation

## 2024-02-20 DIAGNOSIS — Z860101 Personal history of adenomatous and serrated colon polyps: Secondary | ICD-10-CM | POA: Insufficient documentation

## 2024-02-22 ENCOUNTER — Encounter (INDEPENDENT_AMBULATORY_CARE_PROVIDER_SITE_OTHER): Payer: Self-pay

## 2024-02-24 ENCOUNTER — Ambulatory Visit (HOSPITAL_COMMUNITY): Admitting: Anesthesiology

## 2024-02-24 ENCOUNTER — Other Ambulatory Visit: Payer: Self-pay

## 2024-02-24 ENCOUNTER — Encounter (HOSPITAL_COMMUNITY): Payer: Self-pay | Admitting: Gastroenterology

## 2024-02-24 ENCOUNTER — Encounter (HOSPITAL_COMMUNITY)
Admission: RE | Disposition: A | Discharge status: Home / Self Care | Payer: Self-pay | Source: Home / Self Care | Attending: Gastroenterology

## 2024-02-24 ENCOUNTER — Ambulatory Visit (HOSPITAL_COMMUNITY)
Admission: RE | Admit: 2024-02-24 | Discharge: 2024-02-24 | Disposition: A | Discharge status: Home / Self Care | Attending: Gastroenterology | Admitting: Gastroenterology

## 2024-02-24 DIAGNOSIS — K641 Second degree hemorrhoids: Secondary | ICD-10-CM

## 2024-02-24 DIAGNOSIS — D122 Benign neoplasm of ascending colon: Secondary | ICD-10-CM | POA: Diagnosis not present

## 2024-02-24 DIAGNOSIS — Z6833 Body mass index (BMI) 33.0-33.9, adult: Secondary | ICD-10-CM | POA: Diagnosis not present

## 2024-02-24 DIAGNOSIS — K644 Residual hemorrhoidal skin tags: Secondary | ICD-10-CM

## 2024-02-24 DIAGNOSIS — I480 Paroxysmal atrial fibrillation: Secondary | ICD-10-CM | POA: Insufficient documentation

## 2024-02-24 DIAGNOSIS — D175 Benign lipomatous neoplasm of intra-abdominal organs: Secondary | ICD-10-CM

## 2024-02-24 DIAGNOSIS — I4891 Unspecified atrial fibrillation: Secondary | ICD-10-CM | POA: Diagnosis not present

## 2024-02-24 DIAGNOSIS — E039 Hypothyroidism, unspecified: Secondary | ICD-10-CM

## 2024-02-24 DIAGNOSIS — Z860101 Personal history of adenomatous and serrated colon polyps: Secondary | ICD-10-CM

## 2024-02-24 DIAGNOSIS — K635 Polyp of colon: Secondary | ICD-10-CM | POA: Diagnosis present

## 2024-02-24 DIAGNOSIS — E669 Obesity, unspecified: Secondary | ICD-10-CM | POA: Diagnosis not present

## 2024-02-24 DIAGNOSIS — Z1211 Encounter for screening for malignant neoplasm of colon: Secondary | ICD-10-CM

## 2024-02-24 DIAGNOSIS — Z860100 Personal history of colon polyps, unspecified: Secondary | ICD-10-CM

## 2024-02-24 HISTORY — PX: COLONOSCOPY: SHX5424

## 2024-02-24 HISTORY — PX: ENDOSCOPIC MUCOSAL RESECTION: SHX6839

## 2024-02-24 SURGERY — COLONOSCOPY
Anesthesia: Monitor Anesthesia Care

## 2024-02-24 MED ORDER — PROPOFOL 1000 MG/100ML IV EMUL
INTRAVENOUS | Status: AC
Start: 2024-02-24 — End: 2024-02-24
  Filled 2024-02-24: qty 100

## 2024-02-24 MED ORDER — PROPOFOL 10 MG/ML IV BOLUS
INTRAVENOUS | Status: DC | PRN
  Administered 2024-02-24: 50 mg via INTRAVENOUS

## 2024-02-24 MED ORDER — PROPOFOL 500 MG/50ML IV EMUL
INTRAVENOUS | Status: DC | PRN
  Administered 2024-02-24: 150 ug/kg/min via INTRAVENOUS

## 2024-02-24 MED ORDER — SODIUM CHLORIDE 0.9 % IV SOLN: INTRAVENOUS | Status: DC

## 2024-02-24 MED ORDER — LIDOCAINE HCL (CARDIAC) PF 100 MG/5ML IV SOSY
PREFILLED_SYRINGE | INTRAVENOUS | Status: DC | PRN
  Administered 2024-02-24: 100 mg via INTRAVENOUS

## 2024-02-24 NOTE — Discharge Instructions (Signed)

## 2024-02-24 NOTE — Anesthesia Procedure Notes (Signed)
 Procedure Name: MAC Date/Time: 02/24/2024 10:34 AM  Performed by: Buster Catheryn SAUNDERS, CRNAPre-anesthesia Checklist: Patient identified, Emergency Drugs available, Suction available, Patient being monitored and Timeout performed Patient Re-evaluated:Patient Re-evaluated prior to induction Oxygen Delivery Method: Simple face mask Placement Confirmation: positive ETCO2

## 2024-02-24 NOTE — Anesthesia Preprocedure Evaluation (Signed)
 Anesthesia Evaluation  Patient identified by MRN, date of birth, ID band Patient awake    Reviewed: Allergy & Precautions, NPO status , Patient's Chart, lab work & pertinent test results  Airway Mallampati: II  TM Distance: >3 FB Neck ROM: Full    Dental no notable dental hx.    Pulmonary neg pulmonary ROS   Pulmonary exam normal        Cardiovascular + dysrhythmias Atrial Fibrillation  Rhythm:Regular Rate:Normal     Neuro/Psych  Headaches    Bipolar Disorder      GI/Hepatic Neg liver ROS,,,Colon polyp    Endo/Other  Hypothyroidism    Renal/GU negative Renal ROS  negative genitourinary   Musculoskeletal negative musculoskeletal ROS (+)    Abdominal Normal abdominal exam  (+)   Peds  Hematology negative hematology ROS (+)   Anesthesia Other Findings   Reproductive/Obstetrics                              Anesthesia Physical Anesthesia Plan  ASA: 3  Anesthesia Plan: MAC   Post-op Pain Management:    Induction: Intravenous  PONV Risk Score and Plan: 1 and Propofol  infusion and Treatment may vary due to age or medical condition  Airway Management Planned: Simple Face Mask and Nasal Cannula  Additional Equipment: None  Intra-op Plan:   Post-operative Plan:   Informed Consent: I have reviewed the patients History and Physical, chart, labs and discussed the procedure including the risks, benefits and alternatives for the proposed anesthesia with the patient or authorized representative who has indicated his/her understanding and acceptance.     Dental advisory given  Plan Discussed with: CRNA  Anesthesia Plan Comments:         Anesthesia Quick Evaluation

## 2024-02-24 NOTE — Interval H&P Note (Signed)
 History and Physical Interval Note:  02/24/2024 9:20 AM  Brett Bailey  has presented today for surgery, with the diagnosis of K63.5.  The various methods of treatment have been discussed with the patient and family. After consideration of risks, benefits and other options for treatment, the patient has consented to  Procedure(s): COLONOSCOPY (N/A) RESECTION, MUCOSAL LESION, GI TRACT, ENDOSCOPIC (N/A) as a surgical intervention.  The patient's history has been reviewed, patient examined, no change in status, stable for surgery.  I have reviewed the patient's chart and labs.  Questions were answered to the patient's satisfaction.     Shakaya Bhullar Mansouraty Jr

## 2024-02-24 NOTE — Transfer of Care (Signed)
 Immediate Anesthesia Transfer of Care Note  Patient: Brett Bailey  Procedure(s) Performed: COLONOSCOPY RESECTION, MUCOSAL LESION, GI TRACT, ENDOSCOPIC  Patient Location: PACU  Anesthesia Type:MAC  Level of Consciousness: awake, alert , and oriented  Airway & Oxygen Therapy: Patient Spontanous Breathing and Patient connected to face mask oxygen  Post-op Assessment: Report given to RN and Post -op Vital signs reviewed and stable  Post vital signs: Reviewed and stable  Last Vitals:  Vitals Value Taken Time  BP    Temp    Pulse    Resp    SpO2      Last Pain:  Vitals:   02/24/24 0953  TempSrc: Temporal  PainSc: 0-No pain         Complications: No notable events documented.

## 2024-02-24 NOTE — Anesthesia Postprocedure Evaluation (Signed)
 Anesthesia Post Note  Patient: Brett Bailey  Procedure(s) Performed: COLONOSCOPY RESECTION, MUCOSAL LESION, GI TRACT, ENDOSCOPIC     Patient location during evaluation: PACU Anesthesia Type: MAC Level of consciousness: awake and alert Pain management: pain level controlled Vital Signs Assessment: post-procedure vital signs reviewed and stable Respiratory status: spontaneous breathing, nonlabored ventilation, respiratory function stable and patient connected to nasal cannula oxygen Cardiovascular status: stable and blood pressure returned to baseline Postop Assessment: no apparent nausea or vomiting Anesthetic complications: no   No notable events documented.  Last Vitals:  Vitals:   02/24/24 1130 02/24/24 1140  BP: (!) 141/63 134/77  Pulse: (!) 58 (!) 50  Resp: 12 17  Temp:    SpO2: 100% 100%    Last Pain:  Vitals:   02/24/24 1111  TempSrc: Oral  PainSc: 0-No pain                 Cordella P [name]

## 2024-02-24 NOTE — Op Note (Signed)
 [facility] Patient Name: Brett Bailey Procedure Date: 02/24/2024 MRN: [id number] Attending MD: Aloha Finner , MD, [id number] Date of Birth: October 07, 1970 CSN: [id number] Age: 53 Admit Type: Outpatient Procedure:                Colonoscopy Indications:              Excision of colonic polyp Providers:                Aloha Finner, MD, Hoy Penner, RN, Lorrayne Kitty, Technician Referring MD:              Medicines:                Monitored Anesthesia Care Complications:            No immediate complications. Estimated Blood Loss:     Estimated blood loss was minimal. Procedure:                Pre-Anesthesia Assessment:                           - Prior to the procedure, a History and Physical                            was performed, and patient medications and                            allergies were reviewed. The patient's tolerance of                            previous anesthesia was also reviewed. The risks                            and benefits of the procedure and the sedation                            options and risks were discussed with the patient.                            All questions were answered, and informed consent                            was obtained. Prior Anticoagulants: The patient has                            taken no anticoagulant or antiplatelet agents. ASA                            Grade Assessment: II - A patient with mild systemic                            disease. After reviewing the risks and benefits,  the patient was deemed in satisfactory condition to                            undergo the procedure.                           After obtaining informed consent, the colonoscope                            was passed under direct vision. Throughout the                            procedure, the patient's blood pressure, pulse, and                            oxygen  saturations were monitored continuously. The                            CF-HQ190L ([id number]) Olympus colonoscope was                            introduced through the anus and advanced to the 3                            cm into the ileum. The colonoscopy was performed                            without difficulty. The patient tolerated the                            procedure. The quality of the bowel preparation was                            good. The terminal ileum, ileocecal valve,                            appendiceal orifice, and rectum were photographed. Scope In: 10:41:50 AM Scope Out: 11:02:02 AM Scope Withdrawal Time: 0 hours 18 minutes 18 seconds  Total Procedure Duration: 0 hours 20 minutes 12 seconds  Findings:      The digital rectal exam findings include hemorrhoids. Pertinent       negatives include no palpable rectal lesions.      The terminal ileum and ileocecal valve appeared normal.      Two sessile polyps were found in the ascending colon. The polyps were 3       to 6 mm in size. These polyps were removed with a cold snare. Resection       and retrieval were complete.      A 28 mm polyp was found in the ascending colon. The polyp was       non-granular lateral spreading. Preparations were made for mucosal       resection. Demarcation of the lesion was performed with high-definition       white light and narrow band imaging to clearly identify the boundaries       of the lesion. EverLift was injected  to raise the lesion. Snare mucosal       resection was performed. Resection and retrieval were complete. Resected       tissue margins were examined and clear of polyp tissue. Coagulation for       tissue destruction using snare tip soft coagulation was successful. To       prevent bleeding after mucosal resection, four hemostatic clips were       successfully placed. Clip manufacturer: [employer]. There was no       bleeding during, or at the end, of the  procedure.      There was a medium-sized peedunculated lipoma, in the sigmoid colon (~30       cm).      Normal mucosa was found in the entire colon otherwise.      Non-bleeding non-thrombosed internal hemorrhoids were found during       retroflexion, during perianal exam and during digital exam. The       hemorrhoids were Grade II (internal hemorrhoids that prolapse but reduce       spontaneously). Impression:               - Hemorrhoids found on digital rectal exam.                           - The examined portion of the ileum was normal.                           - Two 3 to 6 mm polyps in the ascending colon,                            removed with a cold snare. Resected and retrieved.                           - One 28 mm polyp in the ascending colon, removed                            with mucosal resection. Resected and retrieved.                            Treated with STSC to margin. Clips were placed.                            Clip manufacturer: [employer].                           - Medium-sized pedunculated lipoma in the sigmoid                            colon.                           - Normal mucosa in the entire examined colon                            otherwise.                           -  Non-bleeding non-thrombosed internal hemorrhoids. Moderate Sedation:      Not Applicable - Patient had care per Anesthesia. Recommendation:           - The patient will be observed post-procedure,                            until all discharge criteria are met.                           - Discharge patient to home.                           - Patient has a contact number available for                            emergencies. The signs and symptoms of potential                            delayed complications were discussed with the                            patient. Return to normal activities tomorrow.                            Written discharge instructions were provided  to the                            patient.                           - High fiber diet.                           - Use FiberCon 1-2 tablets PO daily.                           - No aspirin, ibuprofen, naproxen, or other                            non-steroidal anti-inflammatory drugs for 1 week                            after polyp removal.                           - Continue present medications.                           - Await pathology results.                           - Repeat colonoscopy in likely 1 year for                            surveillance based on pathology results.                           -  The findings and recommendations were discussed                            with the patient.                           - The findings and recommendations were discussed                            with the patient's family. Procedure Code(s):        --- Professional ---                           [phone], Colonoscopy, flexible; with endoscopic                            mucosal resection                           45385, 59, Colonoscopy, flexible; with removal of                            tumor(s), polyp(s), or other lesion(s) by snare                            technique Diagnosis Code(s):        --- Professional ---                           K64.1, Second degree hemorrhoids                           D12.2, Benign neoplasm of ascending colon                           K63.5, Polyp of colon CPT copyright 2022 [employer]. All rights reserved. The codes documented in this report are preliminary and upon coder review may  be revised to meet current compliance requirements. Aloha Finner, MD 02/24/2024 11:17:23 AM Number of Addenda: 0

## 2024-02-27 ENCOUNTER — Encounter (HOSPITAL_COMMUNITY): Payer: Self-pay | Admitting: Gastroenterology

## 2024-02-28 LAB — SURGICAL PATHOLOGY

## 2024-02-29 ENCOUNTER — Ambulatory Visit: Payer: Self-pay | Admitting: Gastroenterology

## 2024-02-29 NOTE — Telephone Encounter (Signed)
 Patient is returning a call back to Dr. Wilhelmenia in regards to his pathology report. Patient is requesting a call back and stated that he will be free for the rest of the evening. Please advise.

## 2024-02-29 NOTE — Progress Notes (Signed)
 Patient reached.  He is aware of the results.  We are awaiting pathology final review in regards to margins.  6 to 71-month versus 48-month follow-up will be based on that discussion. He is appreciative for the call back.

## 2024-05-15 ENCOUNTER — Ambulatory Visit (INDEPENDENT_AMBULATORY_CARE_PROVIDER_SITE_OTHER): Admitting: Otolaryngology

## 2024-05-15 ENCOUNTER — Encounter (INDEPENDENT_AMBULATORY_CARE_PROVIDER_SITE_OTHER): Payer: Self-pay | Admitting: Otolaryngology

## 2024-05-15 VITALS — BP 130/75 | HR 54 | Temp 98.0°F | Ht 71.0 in | Wt 228.0 lb

## 2024-05-15 DIAGNOSIS — J3089 Other allergic rhinitis: Secondary | ICD-10-CM

## 2024-05-15 DIAGNOSIS — R0981 Nasal congestion: Secondary | ICD-10-CM | POA: Diagnosis not present

## 2024-05-15 DIAGNOSIS — R0982 Postnasal drip: Secondary | ICD-10-CM

## 2024-05-15 DIAGNOSIS — R442 Other hallucinations: Secondary | ICD-10-CM

## 2024-05-15 DIAGNOSIS — G8929 Other chronic pain: Secondary | ICD-10-CM

## 2024-05-15 DIAGNOSIS — H938X3 Other specified disorders of ear, bilateral: Secondary | ICD-10-CM

## 2024-05-15 MED ORDER — LEVOCETIRIZINE DIHYDROCHLORIDE 5 MG PO TABS: 5.0000 mg | ORAL_TABLET | Freq: Every evening | ORAL | 3 refills | Status: AC

## 2024-05-15 MED ORDER — FLUTICASONE PROPIONATE 50 MCG/ACT NA SUSP: 2.0000 | Freq: Two times a day (BID) | NASAL | 6 refills | Status: AC

## 2024-05-15 NOTE — Patient Instructions (Addendum)
 Andres [facility]s Med Nasal Saline Rinse   - start nasal saline rinses with NeilMed Bottle available over the counter or online to help with nasal congestion     See information about Eustachian Tube Dysfunction below:    Overview The eustachian (say "you-STAY-shee-un") tubes connect the middle ear on each side to the back of the throat. They keep air pressure stable in the ears. If your eustachian tubes become blocked, the air pressure in your ears changes. A quick change in air pressure can cause eustachian tubes to close up. This might happen when an airplane changes altitude or when a scuba diver goes up or down underwater. And a cold can make the tubes swell and block the fluid in the middle ear from draining out. That can cause pain.  Eustachian tube problems often clear up on their own or after treating the cause of the blockage. If your tubes continue to be blocked, you may need surgery.  Follow-up care is a key part of your treatment and safety. Be sure to make and go to all appointments, and call your doctor or nurse advice line (811 in most provinces and territories) if you are having problems. It's also a good idea to know your test results and keep a list of the medicines you take.  How can you care for yourself at home? Try a simple exercise to help open blocked tubes. Close your mouth, hold your nose, and gently blow as if you are blowing your nose. Yawning and chewing gum also may help. You may hear or feel a "pop" when the tubes open. To ease ear pain, apply a warm face cloth or a heating pad set on low. There may be some drainage from the ear when the heat melts earwax. Put a cloth between the heat source and your skin. If your doctor prescribed antibiotics, take them as directed. Do not stop taking them just because you feel better. You need to take the full course of antibiotics. Be safe with medicines. Depending on the cause of the problem, your doctor may recommend over-the-counter  medicine. For example, adults may try decongestants for cold symptoms or nasal spray steroids for allergies. Follow the instructions carefully.

## 2024-05-15 NOTE — Progress Notes (Signed)
 ENT CONSULT:  Reason for Consult: phantom smells and ear pressure   HPI: Discussed the use of AI scribe software for clinical note transcription with the patient, who gave verbal consent to proceed.  History of Present Illness Brett Bailey is a 53 year old male who presents with phantom smells and ear congestion.  He has been experiencing phantom smells, specifically cigarette smoke, for over a year. These symptoms began around the time he likely had COVID-19. The phantom smell occurs frequently and intensifies when he is tired. His general sense of smell remains intact, and the phantom smell dissipates when he actively smells another object.  He also experiences a sensation of ear congestion. He has a history of producing a lot of earwax in his youth, requiring frequent cleanings, but has not maintained regular ear care in recent years. He has used Debrox drops in the past but has become lax in his use. No history of environmental allergies and he has never been tested for allergies. He has not undergone any ear surgeries.  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     Records Reviewed:  Dr Kip PCP Margarete 02/01/24 Seen for phantosmia     Past Medical History:  Diagnosis Date   Alcoholic (HCC)    History of chest pain    History of pericarditis    Obesity (BMI 30-39.9)    PAF (paroxysmal atrial fibrillation) (HCC)    Palpitations    Thyroid disease     Past Surgical History:  Procedure Laterality Date   COLONOSCOPY N/A 02/24/2024   Procedure: COLONOSCOPY;  Surgeon: Mansouraty, Aloha Raddle., MD;  Location: [facility];  Service: Gastroenterology;  Laterality: N/A;   ENDOSCOPIC MUCOSAL RESECTION N/A 02/24/2024   Procedure: RESECTION, MUCOSAL LESION, GI TRACT, ENDOSCOPIC;  Surgeon: Wilhelmenia, Aloha Raddle., MD;  Location: [facility];  Service: Gastroenterology;  Laterality: N/A;   THYROIDECTOMY       Family History  Problem Relation Age of Onset   Heart failure Father    Colon cancer Neg Hx    Esophageal cancer Neg Hx    Inflammatory bowel disease Neg Hx    Liver disease Neg Hx    Pancreatic cancer Neg Hx    Rectal cancer Neg Hx    Stomach cancer Neg Hx     Social History:  reports that he has never smoked. He has never been exposed to tobacco smoke. He has never used smokeless tobacco. He reports current alcohol use of about 14.0 standard drinks of alcohol per week. He reports that he does not use drugs.  Allergies: No Known Allergies  Medications: I have reviewed the patient's current medications.  The PMH, PSH, Medications, Allergies, and SH were reviewed and updated.  ROS: Constitutional: Negative for fever, weight loss and weight gain. Cardiovascular: Negative for chest pain and dyspnea on exertion. Respiratory: Is not experiencing shortness of breath at rest. Gastrointestinal: Negative for nausea and vomiting. Neurological: Negative for headaches. Psychiatric: The patient is not nervous/anxious  Blood pressure 130/75, pulse (!) 54, temperature 98 F (36.7 C), height 5' 11 (1.803 m), weight 228 lb (103.4 kg), SpO2 95%. Body mass index is 31.8 kg/m.  PHYSICAL EXAM:  Exam: General: Well-developed, well-nourished Communication and Voice: Clear pitch and clarity Respiratory Respiratory effort: Equal inspiration and expiration without stridor Cardiovascular Peripheral Vascular: Warm extremities with equal color/perfusion Eyes: No nystagmus with equal extraocular motion bilaterally Neuro/Psych/Balance: Patient oriented to person, place, and time; Appropriate  mood and affect; Gait is intact with no imbalance; Cranial nerves I-XII are intact Head and Face Inspection: Normocephalic and atraumatic without mass or lesion Palpation: Facial skeleton intact without bony stepoffs Salivary Glands: No mass or tenderness Facial Strength: Facial motility symmetric and full  bilaterally ENT Pinna: External ear intact and fully developed External canal: Canal is patent with intact skin minimal cerumen Tympanic Membrane: Clear and mobile External Nose: No scar or anatomic deformity Internal Nose: Septum is deviated to the left. No polyp, or purulence. Mucosal edema and erythema present.  Bilateral inferior turbinate hypertrophy.  Lips, Teeth, and gums: Mucosa and teeth intact and viable TMJ: No pain to palpation with full mobility Oral cavity/oropharynx: No erythema or exudate, no lesions present Nasopharynx: No mass or lesion with intact mucosa Neck Neck and Trachea: Midline trachea without mass or lesion Thyroid: No mass or nodularity Lymphatics: No lymphadenopathy  Procedure:   PROCEDURE NOTE: nasal endoscopy  Preoperative diagnosis: chronic sinusitis symptoms  Postoperative diagnosis: same  Procedure: Diagnostic nasal endoscopy (68768)  Surgeon: Elena Larry, M.D.  Anesthesia: Topical lidocaine  and Afrin  H&P REVIEW: The patient's history and physical were reviewed today prior to procedure. All medications were reviewed and updated as well. Complications: None Condition is stable throughout exam Indications and consent: The patient presents with symptoms of chronic sinusitis not responding to previous therapies. All the risks, benefits, and potential complications were reviewed with the patient preoperatively and informed consent was obtained. The time out was completed with confirmation of the correct procedure.   Procedure: The patient was seated upright in the clinic. Topical lidocaine  and Afrin were applied to the nasal cavity. After adequate anesthesia had occurred, the rigid nasal endoscope was passed into the nasal cavity. The nasal mucosa, turbinates, septum, and sinus drainage pathways were visualized bilaterally. This revealed no purulence or significant secretions that might be cultured. There were no polyps or sites of significant  inflammation. The mucosa was intact and there was no crusting present. The scope was then slowly withdrawn and the patient tolerated the procedure well. There were no complications or blood loss.    Studies Reviewed: CXR 01/22/20 FINDINGS: The cardiomediastinal silhouette is within normal limits. The lungs are well inflated. No confluent airspace opacity, edema, sizable pleural effusion, or pneumothorax is identified with note made of incomplete imaging of the posterior costophrenic angles. There is mild-to-moderate thoracic dextroscoliosis.   IMPRESSION: No active cardiopulmonary disease  Assessment/Plan: Encounter Diagnoses  Name Primary?   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 Symptoms worsen with fatigue. Temporary relief with active smelling. No nasal masses/polyps on nasal endoscopy, just has evidence of nasal congestion - MRI brain w contrast to rule out intracranial mass especially due to regular headaches - Prescribed Xyzal 5 mg and Flonase for nasal congestion and to help with sense of smell - Recommend nasal saline rinses. .  Eustachian tube dysfunction with ear fullness Intermittent ear fullness, likely due to Eustachian tube dysfunction. Normal ear exam today. Symptoms may be related to nasal congestion and environmental allergies - Prescribed Xyzal 5 mg and Flonase for nasal congestion. - Consider ear tubes or Eustachian tube dilation if symptoms do not improve with medication and  nasal spray.  MRI w/con      Thank you for allowing me to participate in the care of this patient. Please do not hesitate to contact me with any questions or  concerns.   Elena Larry, MD Otolaryngology [facility] Phone: [phone] Fax: [phone]    05/15/2024, 4:12 PM

## 2024-05-26 ENCOUNTER — Ambulatory Visit (HOSPITAL_COMMUNITY)

## 2024-06-16 ENCOUNTER — Ambulatory Visit
Admission: RE | Admit: 2024-06-16 | Discharge: 2024-06-16 | Disposition: A | Discharge status: Home / Self Care | Source: Ambulatory Visit | Attending: Otolaryngology | Admitting: Otolaryngology

## 2024-06-16 DIAGNOSIS — R519 Headache, unspecified: Secondary | ICD-10-CM

## 2024-06-16 DIAGNOSIS — R442 Other hallucinations: Secondary | ICD-10-CM

## 2024-06-16 MED ORDER — GADOPICLENOL 0.5 MMOL/ML IV SOLN
10.0000 mL | Freq: Once | INTRAVENOUS | Status: AC | PRN
  Administered 2024-06-16: 10 mL via INTRAVENOUS
